# Patient Record
Sex: Female | Born: 1955 | Race: White | Hispanic: No | Marital: Single | Smoking: Former smoker
Health system: Southern US, Community
[De-identification: ages and names within clinical notes are randomized; demographics above are authoritative.]

## PROBLEM LIST (undated history)

## (undated) DIAGNOSIS — R519 Headache, unspecified: Secondary | ICD-10-CM

## (undated) DIAGNOSIS — E079 Disorder of thyroid, unspecified: Secondary | ICD-10-CM

## (undated) DIAGNOSIS — M1990 Unspecified osteoarthritis, unspecified site: Secondary | ICD-10-CM

## (undated) DIAGNOSIS — T7840XA Allergy, unspecified, initial encounter: Secondary | ICD-10-CM

## (undated) DIAGNOSIS — R51 Headache: Secondary | ICD-10-CM

## (undated) HISTORY — DX: Headache, unspecified: R51.9

## (undated) HISTORY — DX: Allergy, unspecified, initial encounter: T78.40XA

## (undated) HISTORY — PX: BREAST BIOPSY: SHX20

## (undated) HISTORY — DX: Disorder of thyroid, unspecified: E07.9

## (undated) HISTORY — DX: Unspecified osteoarthritis, unspecified site: M19.90

## (undated) HISTORY — DX: Headache: R51

---
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
## 2006-04-23 ENCOUNTER — Ambulatory Visit: Payer: Self-pay | Admitting: Internal Medicine

## 2006-11-24 ENCOUNTER — Ambulatory Visit: Payer: Self-pay | Admitting: Internal Medicine

## 2006-11-28 ENCOUNTER — Ambulatory Visit: Payer: Self-pay | Admitting: Internal Medicine

## 2007-01-08 ENCOUNTER — Inpatient Hospital Stay: Payer: Self-pay | Admitting: Specialist

## 2007-01-08 ENCOUNTER — Other Ambulatory Visit: Payer: Self-pay

## 2007-02-16 ENCOUNTER — Ambulatory Visit: Payer: Self-pay | Admitting: Gastroenterology

## 2007-06-05 ENCOUNTER — Ambulatory Visit: Payer: Self-pay | Admitting: Internal Medicine

## 2007-12-20 ENCOUNTER — Ambulatory Visit: Payer: Self-pay | Admitting: Internal Medicine

## 2008-01-03 ENCOUNTER — Ambulatory Visit: Payer: Self-pay | Admitting: Internal Medicine

## 2008-03-11 ENCOUNTER — Ambulatory Visit: Payer: Self-pay | Admitting: General Surgery

## 2008-09-18 ENCOUNTER — Ambulatory Visit: Payer: Self-pay | Admitting: General Surgery

## 2009-02-12 ENCOUNTER — Ambulatory Visit: Payer: Self-pay | Admitting: Family Medicine

## 2009-12-18 ENCOUNTER — Ambulatory Visit: Payer: Self-pay | Admitting: Family Medicine

## 2011-04-08 ENCOUNTER — Ambulatory Visit: Payer: Self-pay | Admitting: Family Medicine

## 2014-11-29 ENCOUNTER — Ambulatory Visit: Payer: Self-pay | Admitting: Nurse Practitioner

## 2014-12-02 ENCOUNTER — Ambulatory Visit (INDEPENDENT_AMBULATORY_CARE_PROVIDER_SITE_OTHER): Payer: Managed Care, Other (non HMO) | Admitting: Nurse Practitioner

## 2014-12-02 ENCOUNTER — Encounter: Payer: Self-pay | Admitting: Nurse Practitioner

## 2014-12-02 VITALS — BP 116/60 | HR 60 | Temp 97.9°F | Ht 65.5 in | Wt 133.8 lb

## 2014-12-02 DIAGNOSIS — Z7689 Persons encountering health services in other specified circumstances: Secondary | ICD-10-CM | POA: Insufficient documentation

## 2014-12-02 DIAGNOSIS — R51 Headache: Secondary | ICD-10-CM

## 2014-12-02 DIAGNOSIS — E039 Hypothyroidism, unspecified: Secondary | ICD-10-CM

## 2014-12-02 DIAGNOSIS — R519 Headache, unspecified: Secondary | ICD-10-CM | POA: Insufficient documentation

## 2014-12-02 DIAGNOSIS — Z7189 Other specified counseling: Secondary | ICD-10-CM | POA: Diagnosis not present

## 2014-12-02 LAB — TSH: TSH: 1.92 u[IU]/mL (ref 0.35–4.50)

## 2014-12-02 LAB — T4, FREE: Free T4: 1.16 ng/dL (ref 0.60–1.60)

## 2014-12-02 NOTE — Assessment & Plan Note (Signed)
Pt reports winter time is worst for her sinus headaches and she uses OTC measures. She reports the only thing that relieves them is for it to snow. Will follow

## 2014-12-02 NOTE — Assessment & Plan Note (Signed)
Will obtain labs today (TSH and free T4). Will refill for 30 days with refills to requested pharmacy when labs come back. Would like to stay with generic if possible.

## 2014-12-02 NOTE — Patient Instructions (Signed)
Welcome to [facility]! Nice to meet you today.   Please visit the lab before leaving today. Medication will be refilled upon receipt of your labs.   See you in 2017 for your physical. We will do fasting labs at that visit so nothing to eat or drink after midnight.

## 2014-12-02 NOTE — Progress Notes (Signed)
Patient ID: Meghan Higgins, female    DOB: [birth year]  Age: 59 y.o. MRN: [id number]  CC: Establish Care   HPI Meghan Higgins presents for establishing care and CC of medication refill.  1) New pt info:  Immunizations- tdap- 2011, flu- getting today at [facility] pharmacy  Mammogram- 2010, [facility]  Pap- 2011- at [facility]   Colonoscopy- 2007 good for 10 years per pt   Eye Exam- Willing to schedule, [facility], glasses  LMP- 2004, Postmenopausal   2) Chronic Problems-  Frequent headaches- Sinus related, year round, but worse in winter, takes Advil and it is somewhat helpful   Allergies- All year, Allegra-D or a benadryl   Thyroid problem- Need labs and refill today   3) Acute Problems-  Refill on Thyroid medication, would like to stay with generic if possible. Would like 30 day supply. Has not had labs in years she reports. [facility] last few refills. She has enough for the next few days.    History Meghan Higgins has a past medical history of Arthritis; Allergy; Thyroid disease; and Frequent headaches.   She has past surgical history that includes Appendectomy (1967).   Her family history includes Cancer in her father, maternal grandmother, and sister; Diabetes in her father and sister; Heart disease in her father; Stroke in her father.She reports that she quit smoking about a year ago. She does not have any smokeless tobacco history on file. She reports that she does not drink alcohol or use illicit drugs.  No outpatient prescriptions prior to visit.   No facility-administered medications prior to visit.    ROS Review of Systems  Constitutional: Negative for fever, chills, diaphoresis and fatigue.  Respiratory: Negative for chest tightness, shortness of breath and wheezing.   Cardiovascular: Negative for chest pain, palpitations and leg swelling.  Gastrointestinal: Negative for nausea, vomiting, diarrhea and rectal pain.  Skin:  Negative for rash.  Neurological: Negative for dizziness, weakness, numbness and headaches.  Psychiatric/Behavioral: The patient is not nervous/anxious.     Objective:  BP 116/60 mmHg  Pulse 60  Temp(Src) 97.9 F (36.6 C) (Oral)  Ht 5' 5.5" (1.664 m)  Wt 133 lb 12.8 oz (60.691 kg)  BMI 21.92 kg/m2  SpO2 97%  Physical Exam  Constitutional: She is oriented to person, place, and time. She appears well-developed and well-nourished. No distress.  HENT:  Head: Normocephalic and atraumatic.  Right Ear: External ear normal.  Left Ear: External ear normal.  Cardiovascular: Normal rate, regular rhythm and normal heart sounds.  Exam reveals no gallop and no friction rub.   No murmur heard. Pulmonary/Chest: Effort normal and breath sounds normal. No respiratory distress. She has no wheezes. She has no rales. She exhibits no tenderness.  Neurological: She is alert and oriented to person, place, and time. No cranial nerve deficit. She exhibits normal muscle tone. Coordination normal.  Skin: Skin is warm and dry. No rash noted. She is not diaphoretic.  Psychiatric: She has a normal mood and affect. Her behavior is normal. Judgment and thought content normal.   Assessment & Plan:   Demari was seen today for establish care.  Diagnoses and all orders for this visit:  Hypothyroidism, unspecified hypothyroidism type -     TSH -     T4, free  Encounter to establish care  Sinus headache   I am having Ms. Lansdowne maintain her fexofenadine-pseudoephedrine, levothyroxine, Biotin, multivitamin, and Fish Oil.  Meds ordered  this encounter  Medications  . fexofenadine-pseudoephedrine (ALLEGRA-D 24) 180-240 MG 24 hr tablet    Sig: Take 1 tablet by mouth daily.  Marland Kitchen. levothyroxine (SYNTHROID, LEVOTHROID) 88 MCG tablet    Sig: Take 88 mcg by mouth daily before breakfast.  . Biotin 300 MCG TABS    Sig: Take by mouth.  . Multiple Vitamin (MULTIVITAMIN) tablet    Sig: Take 1 tablet by mouth daily.  .  Omega-3 Fatty Acids (FISH OIL) 1000 MG CAPS    Sig: Take by mouth.     Follow-up: Return if symptoms worsen or fail to improve, for CPE w/ fasting labs in 2017.

## 2014-12-02 NOTE — Assessment & Plan Note (Signed)
Discussed acute and chronic issues. Reviewed health maintenance measures, PFSHx, and immunizations. Obtain TSH and free T4 today.  Pt will schedule physical exam for early 2017 and we will get health maintenance up to date.

## 2014-12-10 ENCOUNTER — Other Ambulatory Visit: Payer: Self-pay | Admitting: Nurse Practitioner

## 2014-12-10 MED ORDER — LEVOTHYROXINE SODIUM 88 MCG PO TABS: 88.0000 ug | ORAL_TABLET | Freq: Every day | ORAL | Status: DC

## 2015-05-04 ENCOUNTER — Other Ambulatory Visit: Payer: Self-pay | Admitting: Nurse Practitioner

## 2015-11-06 ENCOUNTER — Ambulatory Visit (INDEPENDENT_AMBULATORY_CARE_PROVIDER_SITE_OTHER): Payer: Managed Care, Other (non HMO) | Admitting: Family Medicine

## 2015-11-06 ENCOUNTER — Encounter (INDEPENDENT_AMBULATORY_CARE_PROVIDER_SITE_OTHER): Payer: Self-pay

## 2015-11-06 ENCOUNTER — Encounter: Payer: Self-pay | Admitting: Family Medicine

## 2015-11-06 VITALS — BP 120/78 | HR 83 | Temp 98.1°F | Wt 133.2 lb

## 2015-11-06 DIAGNOSIS — R059 Cough, unspecified: Secondary | ICD-10-CM

## 2015-11-06 DIAGNOSIS — R05 Cough: Secondary | ICD-10-CM

## 2015-11-06 DIAGNOSIS — J209 Acute bronchitis, unspecified: Secondary | ICD-10-CM

## 2015-11-06 MED ORDER — ALBUTEROL SULFATE (2.5 MG/3ML) 0.083% IN NEBU
2.5000 mg | INHALATION_SOLUTION | Freq: Once | RESPIRATORY_TRACT | Status: AC
Start: 2015-11-06 — End: 2015-11-06
  Administered 2015-11-06: 2.5 mg via RESPIRATORY_TRACT

## 2015-11-06 MED ORDER — DOXYCYCLINE HYCLATE 100 MG PO TABS: 100.0000 mg | ORAL_TABLET | Freq: Two times a day (BID) | ORAL | 0 refills | Status: DC

## 2015-11-06 MED ORDER — ALBUTEROL SULFATE HFA 108 (90 BASE) MCG/ACT IN AERS: 2.0000 | INHALATION_SPRAY | Freq: Four times a day (QID) | RESPIRATORY_TRACT | 0 refills | Status: DC | PRN

## 2015-11-06 NOTE — Progress Notes (Signed)
  Meghan AlarEric Karver Fadden, MD Phone: [phone]  Meghan HerbMarie E Higgins is a 60 y.o. female who presents today for same-day visit.  Patient notes 1 week of cough, sinus pressure, postnasal drip, and chest congestion. Cough productive of clear mucus. She has had some wheezing. Notes when she goes into a cooler environment such as the cooler at work she gets some bronchospasms. She notes no true fever though did have a MAXIMUM TEMPERATURE of 100.38F several days ago. She has a history of this in the past though none recently. Has tried Sudafed, Mucinex, and Flonase with some benefit.  PMH: Former smoker   ROS see history of present illness  Objective  Physical Exam Vitals:   11/06/15 1027  BP: 120/78  Pulse: 83  Temp: 98.1 F (36.7 C)    BP Readings from Last 3 Encounters:  11/06/15 120/78  12/02/14 116/60   Wt Readings from Last 3 Encounters:  11/06/15 133 lb 3.2 oz (60.4 kg)  12/02/14 133 lb 12.8 oz (60.7 kg)    Physical Exam  Constitutional: She is well-developed, well-nourished, and in no distress.  HENT:  Head: Normocephalic and atraumatic.  Mouth/Throat: Oropharynx is clear and moist. No oropharyngeal exudate.  Bilateral TMs obscured by cerumen  Silva: Conjunctivae are normal. Pupils are equal, round, and reactive to light.  Cardiovascular: Normal rate, regular rhythm and normal heart sounds.   Pulmonary/Chest: Effort normal. No respiratory distress. She has wheezes (scattered mild expiratory wheezes). She has no rales.  Musculoskeletal: She exhibits no edema.  Neurological: She is alert. Gait normal.  Skin: Skin is warm and dry.   Patient was given an albuterol nebulizer treatment in the office. She reported improvement in her symptoms. She continued to have mild scattered wheezes during the expiratory phase after the breathing treatment.  Assessment/Plan: Please see individual problem list.  Acute bronchitis Patient's symptoms most consistent with bronchitis and sinusitis. No  focal findings to indicate pneumonia. We will treat with doxycycline to cover for sinus infection and pulmonary causes. We will start her on an albuterol inhaler every 6 hours for the next 2 days and then as needed. She has an allergy to prednisone so we cannot treat with this. She'll continue to monitor. If not improving by Monday she will follow-up. Given return precautions.   No orders of the defined types were placed in this encounter.   Meds ordered this encounter  Medications  . fluticasone (FLONASE) 50 MCG/ACT nasal spray    Sig: Place into both nostrils daily.  Marland Kitchen. doxycycline (VIBRA-TABS) 100 MG tablet    Sig: Take 1 tablet (100 mg total) by mouth 2 (two) times daily.    Dispense:  14 tablet    Refill:  0  . albuterol (PROVENTIL HFA;VENTOLIN HFA) 108 (90 Base) MCG/ACT inhaler    Sig: Inhale 2 puffs into the lungs every 6 (six) hours as needed for wheezing or shortness of breath.    Dispense:  1 Inhaler    Refill:  0  . albuterol (PROVENTIL) (2.5 MG/3ML) 0.083% nebulizer solution 2.5 mg    Meghan AlarEric Meghan Charette, MD [facility]

## 2015-11-06 NOTE — Progress Notes (Signed)
Pre visit review using our clinic review tool, if applicable. No additional management support is needed unless otherwise documented below in the visit note. 

## 2015-11-06 NOTE — Assessment & Plan Note (Signed)
Patient's symptoms most consistent with bronchitis and sinusitis. No focal findings to indicate pneumonia. We will treat with doxycycline to cover for sinus infection and pulmonary causes. We will start her on an albuterol inhaler every 6 hours for the next 2 days and then as needed. She has an allergy to prednisone so we cannot treat with this. She'll continue to monitor. If not improving by Monday she will follow-up. Given return precautions.

## 2015-11-06 NOTE — Patient Instructions (Addendum)
Nice to meet you. You likely have a sinus infection and/or bronchitis. We will treat with doxycycline. You can use the albuterol inhaler every 6 hours for the next 2 days and then as needed. If you develop shortness of breath, cough productive of blood, fevers, or any new or changing symptoms please seek medical attention immediately.

## 2015-11-26 ENCOUNTER — Other Ambulatory Visit: Payer: Self-pay | Admitting: Nurse Practitioner

## 2015-11-26 NOTE — Telephone Encounter (Signed)
Next office visit 01/01/16 CPE establish care Last office visit 12/07/15 acute

## 2015-11-27 NOTE — Telephone Encounter (Signed)
Refill sent to pharmacy. Patient needs to keep her next office visit.

## 2015-11-27 NOTE — Telephone Encounter (Signed)
Patient advised and verbalized an underestanding

## 2016-01-01 ENCOUNTER — Ambulatory Visit (INDEPENDENT_AMBULATORY_CARE_PROVIDER_SITE_OTHER): Payer: Managed Care, Other (non HMO) | Admitting: Family Medicine

## 2016-01-01 ENCOUNTER — Other Ambulatory Visit (HOSPITAL_COMMUNITY)
Admission: RE | Admit: 2016-01-01 | Discharge: 2016-01-01 | Disposition: A | Discharge status: Home / Self Care | Payer: Managed Care, Other (non HMO) | Source: Ambulatory Visit | Attending: Family Medicine | Admitting: Family Medicine

## 2016-01-01 ENCOUNTER — Encounter: Payer: Self-pay | Admitting: Family Medicine

## 2016-01-01 ENCOUNTER — Telehealth: Payer: Self-pay | Admitting: Family Medicine

## 2016-01-01 VITALS — BP 100/72 | HR 65 | Temp 98.0°F | Wt 134.8 lb

## 2016-01-01 DIAGNOSIS — N649 Disorder of breast, unspecified: Secondary | ICD-10-CM | POA: Diagnosis not present

## 2016-01-01 DIAGNOSIS — Z1151 Encounter for screening for human papillomavirus (HPV): Secondary | ICD-10-CM | POA: Insufficient documentation

## 2016-01-01 DIAGNOSIS — Z114 Encounter for screening for human immunodeficiency virus [HIV]: Secondary | ICD-10-CM

## 2016-01-01 DIAGNOSIS — Z01419 Encounter for gynecological examination (general) (routine) without abnormal findings: Secondary | ICD-10-CM | POA: Insufficient documentation

## 2016-01-01 DIAGNOSIS — Z1159 Encounter for screening for other viral diseases: Secondary | ICD-10-CM

## 2016-01-01 DIAGNOSIS — Z1329 Encounter for screening for other suspected endocrine disorder: Secondary | ICD-10-CM

## 2016-01-01 DIAGNOSIS — Z13220 Encounter for screening for lipoid disorders: Secondary | ICD-10-CM

## 2016-01-01 DIAGNOSIS — Z0001 Encounter for general adult medical examination with abnormal findings: Secondary | ICD-10-CM | POA: Diagnosis not present

## 2016-01-01 DIAGNOSIS — Z124 Encounter for screening for malignant neoplasm of cervix: Secondary | ICD-10-CM

## 2016-01-01 DIAGNOSIS — Z130 Encounter for screening for diseases of the blood and blood-forming organs and certain disorders involving the immune mechanism: Secondary | ICD-10-CM

## 2016-01-01 DIAGNOSIS — Z23 Encounter for immunization: Secondary | ICD-10-CM

## 2016-01-01 DIAGNOSIS — Z1231 Encounter for screening mammogram for malignant neoplasm of breast: Secondary | ICD-10-CM

## 2016-01-01 DIAGNOSIS — Z1211 Encounter for screening for malignant neoplasm of colon: Secondary | ICD-10-CM

## 2016-01-01 DIAGNOSIS — Z0000 Encounter for general adult medical examination without abnormal findings: Secondary | ICD-10-CM | POA: Insufficient documentation

## 2016-01-01 LAB — LIPID PANEL
CHOL/HDL RATIO: 4
Cholesterol: 208 mg/dL — ABNORMAL HIGH (ref 0–200)
HDL: 48.6 mg/dL (ref >39.00)
LDL CALC: 138 mg/dL — AB (ref 0–99)
NONHDL: 159.64
TRIGLYCERIDES: 108 mg/dL (ref 0.0–149.0)
VLDL: 21.6 mg/dL (ref 0.0–40.0)

## 2016-01-01 LAB — COMPREHENSIVE METABOLIC PANEL
ALT: 18 U/L (ref 0–35)
AST: 16 U/L (ref 0–37)
Albumin: 4.6 g/dL (ref 3.5–5.2)
Alkaline Phosphatase: 74 U/L (ref 39–117)
BILIRUBIN TOTAL: 0.4 mg/dL (ref 0.2–1.2)
BUN: 20 mg/dL (ref 6–23)
CALCIUM: 9.5 mg/dL (ref 8.4–10.5)
CHLORIDE: 103 meq/L (ref 96–112)
CO2: 30 meq/L (ref 19–32)
Creatinine, Ser: 0.77 mg/dL (ref 0.40–1.20)
GFR: 81.09 mL/min (ref >60.00)
GLUCOSE: 87 mg/dL (ref 70–99)
Potassium: 4.5 mEq/L (ref 3.5–5.1)
Sodium: 140 mEq/L (ref 135–145)
Total Protein: 7 g/dL (ref 6.0–8.3)

## 2016-01-01 LAB — CBC
HCT: 39.2 % (ref 36.0–46.0)
Hemoglobin: 13.2 g/dL (ref 12.0–15.0)
MCHC: 33.6 g/dL (ref 30.0–36.0)
MCV: 85.1 fl (ref 78.0–100.0)
Platelets: 287 10*3/uL (ref 150.0–400.0)
RBC: 4.61 Mil/uL (ref 3.87–5.11)
RDW: 13.4 % (ref 11.5–15.5)
WBC: 5.2 10*3/uL (ref 4.0–10.5)

## 2016-01-01 LAB — TSH: TSH: 1.38 u[IU]/mL (ref 0.35–4.50)

## 2016-01-01 NOTE — Telephone Encounter (Signed)
FYI - Pt called back with information on colonoscopy. It was done at [facility] on the same day as surgery, she does not have the doctor's name.

## 2016-01-01 NOTE — Progress Notes (Signed)
Tommi Rumps, MD Phone: [phone]  Meghan Higgins is a 60 y.o. female who presents today for physical exam.  Diet is good. Eats English muffin or oatmeal for breakfast. 3 inches sub for lunch. Chicken and vegetables typically for dinner. Occasional red meat. No fried or fast food. Exercises at work by lifting 50 pound bags. Tetanus vaccination 5 years ago. She does not think she had chickenpox when she was younger. Flu shot up-to-date. Due for colonoscopy. Due for Pap smear. Due for mammogram. No HIV or hepatitis C testing previously.  With smoking 2 years ago. Smoked mostly half a pack a day for 40 years.  She sees an ophthalmologist once yearly. Sees a dentist twice a year.  Active Ambulatory Problems    Diagnosis Date Noted  . Thyroid activity decreased 12/02/2014  . Sinus headache 12/02/2014  . Acute bronchitis 11/06/2015  . Encounter for general adult medical examination with abnormal findings 01/01/2016   Resolved Ambulatory Problems    Diagnosis Date Noted  . Encounter to establish care 12/02/2014   Past Medical History:  Diagnosis Date  . Allergy   . Arthritis   . Frequent headaches   . Thyroid disease     Family History  Problem Relation Age of Onset  . Cancer Father   . Heart disease Father   . Stroke Father   . Diabetes Father   . Diabetes Sister   . Cancer Maternal Grandmother   . Cancer Sister     Social History   Social History  . Marital status: Single    Spouse name: N/A  . Number of children: N/A  . Years of education: N/A   Occupational History  . Not on file.   Social History Main Topics  . Smoking status: Former Smoker    Quit date: 12/01/2013  . Smokeless tobacco: Not on file  . Alcohol use No  . Drug use: No  . Sexual activity: No   Other Topics Concern  . 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 CV:  Negative for chest pain, dyspnea, edema, palpitations Resp: Negative for cough, dyspnea, hemoptysis GI: Negative for nausea, vomiting, diarrhea, constipation, abdominal pain, melena, hematochezia. GU: Negative for dysuria, incontinence, urinary hesitance, hematuria, vaginal or penile discharge, polyuria, sexual difficulty, lumps in testicle or breasts MSK: Negative for muscle cramps or aches, joint pain or swelling Neuro: Negative for headaches, weakness, numbness, dizziness, passing out/fainting Psych: Negative for depression, anxiety, memory problems  Objective  Physical Exam Vitals:   01/01/16 0906  BP: 100/72  Pulse: 65  Temp: 98 F (36.7 C)    BP Readings from Last 3 Encounters:  01/01/16 100/72  11/06/15 120/78  12/02/14 116/60   Wt Readings from Last 3 Encounters:  01/01/16 134 lb 12.8 oz (61.1 kg)  11/06/15 133 lb 3.2 oz (60.4 kg)  12/02/14 133 lb 12.8 oz (60.7 kg)    Physical Exam  Constitutional: She is well-developed, well-nourished,  and in no distress.  HENT:  Head: Normocephalic and atraumatic.  Mouth/Throat: Oropharynx is clear and moist. No oropharyngeal exudate.  Campoli: Conjunctivae are normal. Pupils are equal, round, and reactive to light.  Neck: Neck supple.  Cardiovascular: Normal rate, regular rhythm and normal heart sounds.   Pulmonary/Chest: Effort normal and breath sounds normal.  Abdominal: Soft. Bowel sounds are normal. She exhibits no distension. There is no tenderness.  Genitourinary:  Genitourinary Comments: Normal labia, normal vaginal mucosa, normal cervix, normal bimanual exam, bilateral breast with small lesion noted in the  inner lower quadrants, no other masses noted, no skin changes noted, no nipple inversion, bilateral axilla with no masses  Musculoskeletal: She exhibits no edema.  Lymphadenopathy:    She has no cervical adenopathy.  Neurological: She is alert. Gait normal.  Skin: Skin is warm and dry.  Psychiatric: Mood and affect normal.     Assessment/Plan:   Encounter for general adult medical examination with abnormal findings Overall patient is doing well. Weight is in the normal range. Blood pressure normal range. Encouraged continued diet and exercise. She is unsure if she had chickenpox as a child and thus we will obtain a varicella titer to determine if she needs a shingles vaccine. Tetanus vaccination up-to-date. Flu shot up-to-date. Due for colon cancer screening. She does not think she will be able to do a colonoscopy given that she has nobody to drive her. We will request her most recent colonoscopy report and if there are no polyps noted we could do cologuard. If there are polyps noted she will need to do a colonoscopy. She will contact us to let us know who did this prior colonoscopy. Lab work as outlined below. Pap smear performed today. Breast exam with possible fatty dense tissue in the bilateral inner lower quadrants and thus we will obtain diagnostic mammogram and ultrasounds.   Orders Placed This Encounter  Procedures  . MM Digital Screening    Standing Status:   Future    Standing Expiration Date:   03/03/2017    Order Specific Question:   Reason for Exam (SYMPTOM  OR DIAGNOSIS REQUIRED)    Answer:   screening    Order Specific Question:   Is the patient pregnant?    Answer:   No    Order Specific Question:   Preferred imaging location?    Answer:   [facility]  . MM Digital Diagnostic Bilat    Standing Status:   Future    Standing Expiration Date:   03/03/2017    Order Specific Question:   Reason for Exam (SYMPTOM  OR DIAGNOSIS REQUIRED)    Answer:   bilateral small lesions  noted in the inner lower quadrants    Order Specific Question:   Is the patient pregnant?    Answer:   No    Order Specific Question:   Preferred imaging location?    Answer:   [facility]  . US BREAST COMPLETE UNI LEFT INC AXILLA    Standing Status:   Future    Standing Expiration Date:   03/03/2017    Order Specific Question:   Reason for Exam (SYMPTOM  OR DIAGNOSIS REQUIRED)    Answer:   bilateral small lesions noted in the inner lower quadrants    Order Specific Question:   Preferred imaging location?    Answer:   [facility]  . US BREAST COMPLETE UNI RIGHT INC AXILLA    Standing Status:   Future    Standing  Expiration Date:   03/03/2017    Order Specific Question:   Reason for Exam (SYMPTOM  OR DIAGNOSIS REQUIRED)    Answer:   bilateral small lesions noted in the inner lower quadrants    Order Specific Question:   Preferred imaging location?    Answer:   [facility]  . TSH  . Comp Met (CMET)  . Lipid Profile  . CBC  . Varicella zoster antibody, IgG  . Hepatitis C Antibody  . HIV antibody (with reflex)    No orders of the defined types were placed in this encounter.    Tommi Rumps, MD [place]

## 2016-01-01 NOTE — Assessment & Plan Note (Addendum)
Overall patient is doing well. Weight is in the normal range. Blood pressure normal range. Encouraged continued diet and exercise. She is unsure if she had chickenpox as a child and thus we will obtain a varicella titer to determine if she needs a shingles vaccine. Tetanus vaccination up-to-date. Flu shot up-to-date. Due for colon cancer screening. She does not think she will be able to do a colonoscopy given that she has nobody to drive her. We will request her most recent colonoscopy report and if there are no polyps noted we could do cologuard. If there are polyps noted she will need to do a colonoscopy. She will contact us to let us know who did this prior colonoscopy. Lab work as outlined below. Pap smear performed today. Breast exam with possible fatty dense tissue in the bilateral inner lower quadrants and thus we will obtain diagnostic mammogram and ultrasounds. Former smoker though does not have a 30-pack-year history.

## 2016-01-01 NOTE — Telephone Encounter (Signed)
Noted, faxed to medical records at [facility]

## 2016-01-01 NOTE — Patient Instructions (Addendum)
Nice to see you. We'll get a mammogram. We'll send a Pap smear off. We'll get you set up for colonoscopy. We will check lab work and call you the results. Please continue to stay active and eat healthy.

## 2016-01-01 NOTE — Progress Notes (Signed)
Pre visit review using our clinic review tool, if applicable. No additional management support is needed unless otherwise documented below in the visit note. 

## 2016-01-02 LAB — HEPATITIS C ANTIBODY: HCV AB: NEGATIVE

## 2016-01-02 LAB — HIV ANTIBODY (ROUTINE TESTING W REFLEX): HIV 1&2 Ab, 4th Generation: NONREACTIVE

## 2016-01-02 LAB — VARICELLA ZOSTER ANTIBODY, IGG: Varicella IgG: <135 Index (ref <135.00)

## 2016-01-02 LAB — CYTOLOGY - PAP
Diagnosis: NEGATIVE
HPV: NOT DETECTED

## 2016-01-25 ENCOUNTER — Other Ambulatory Visit: Payer: Self-pay | Admitting: Family Medicine

## 2016-01-26 NOTE — Telephone Encounter (Signed)
Last tsh 01/01/16, last filled 11/27/15 30 1rf

## 2016-02-05 ENCOUNTER — Other Ambulatory Visit: Payer: Self-pay | Admitting: Family Medicine

## 2016-02-05 ENCOUNTER — Ambulatory Visit
Admission: RE | Admit: 2016-02-05 | Discharge: 2016-02-05 | Disposition: A | Discharge status: Home / Self Care | Payer: Managed Care, Other (non HMO) | Source: Ambulatory Visit | Attending: Family Medicine | Admitting: Family Medicine

## 2016-02-05 ENCOUNTER — Ambulatory Visit: Payer: Managed Care, Other (non HMO)

## 2016-02-05 DIAGNOSIS — N649 Disorder of breast, unspecified: Secondary | ICD-10-CM

## 2016-02-05 DIAGNOSIS — N6310 Unspecified lump in the right breast, unspecified quadrant: Secondary | ICD-10-CM | POA: Insufficient documentation

## 2016-02-05 DIAGNOSIS — N6320 Unspecified lump in the left breast, unspecified quadrant: Secondary | ICD-10-CM | POA: Diagnosis present

## 2016-03-26 ENCOUNTER — Other Ambulatory Visit: Payer: Self-pay | Admitting: Family Medicine

## 2016-05-20 ENCOUNTER — Encounter: Payer: Self-pay | Admitting: Podiatry

## 2016-05-20 ENCOUNTER — Ambulatory Visit (INDEPENDENT_AMBULATORY_CARE_PROVIDER_SITE_OTHER): Payer: Managed Care, Other (non HMO) | Admitting: Podiatry

## 2016-05-20 VITALS — BP 121/83 | HR 80

## 2016-05-20 DIAGNOSIS — L989 Disorder of the skin and subcutaneous tissue, unspecified: Secondary | ICD-10-CM

## 2016-05-20 DIAGNOSIS — B070 Plantar wart: Secondary | ICD-10-CM | POA: Diagnosis not present

## 2016-05-20 NOTE — Progress Notes (Signed)
   Subjective:    Patient ID: Meghan Higgins, female    DOB: [birth year], 61 y.o.   MRN: [id number]  HPI this patient presents the office with chief complaint of a painful callus under the ball of her left foot. She says that this callus has been present for over 15 years. She says that she files the callus down by herself at home. She has noticed that it is starting to increase in size and she presents the office today concerned about it growing.  Her only self treatment is debridement of the callus area when the callus becomes painful    Review of Systems  Musculoskeletal:       Joint pain       Objective:   Physical Exam GENERAL APPEARANCE: Alert, conversant. Appropriately groomed. No acute distress.  VASCULAR: Pedal pulses are  palpable at  [facility] bilateral.  Capillary refill time is immediate to all digits,  Normal temperature gradient.   NEUROLOGIC: sensation is normal to 5.07 monofilament at 5/5 sites bilateral.  Light touch is intact bilateral, Muscle strength normal.  MUSCULOSKELETAL: acceptable muscle strength, tone and stability bilateral.  Intrinsic muscluature intact bilateral.  Rectus appearance of foot and digits noted bilateral.   DERMATOLOGIC: skin color, texture, and turgor are within normal limits.  No preulcerative lesions or ulcers  are seen, no interdigital maceration noted.  No open lesions present.  Digital nails are asymptomatic. No drainage noted. Patient has a diffuse cauliflower-appearing lesion under the ball of the left foot. There are black thrombi noted and upon debridement, there is bleeding pain is elicited from lateral pressure        Assessment & Plan:  Benign skin lesion  Verrucae   IE  Debride infected skin.  Discussed treatment with this patient. I recommended a topical be used as opposed to excision of the lesion to the size of the main wart, left foot. She is to use salicylic acid at home and she will call me if she wants to proceed with further  treatment   Helane GuntherGregory Jesse Hirst DPM

## 2016-05-23 ENCOUNTER — Other Ambulatory Visit: Payer: Self-pay | Admitting: Family Medicine

## 2016-06-21 ENCOUNTER — Telehealth: Payer: Self-pay | Admitting: Family Medicine

## 2016-06-21 DIAGNOSIS — N6459 Other signs and symptoms in breast: Secondary | ICD-10-CM

## 2016-06-21 NOTE — Telephone Encounter (Signed)
Pt called and stated that she needs orders for a diagnostic mammogram and a breast ultrasound. Pt asked to please be scheduled for a Thursday morning. Please advise, thank you!

## 2016-06-21 NOTE — Telephone Encounter (Signed)
Order placed. She's not due for repeat until 08/04/16. She is due for diagnostic mammogram and then they will determine whether or not she needs ultrasounds based on the mammogram report from prior. We'll forward to HeboMelissa.

## 2016-06-21 NOTE — Telephone Encounter (Signed)
Please advise 

## 2016-06-23 NOTE — Telephone Encounter (Signed)
Called to schedule for pt. [facility] need the left breast ultrasound added since it is a possible ultrasound per report.  [id number].  Sorry! MAYBE one day they will give me clearance to order these and not have to send you message after message.  Thanks, General MillsMelissa

## 2016-06-23 NOTE — Telephone Encounter (Signed)
Order placed

## 2016-07-07 NOTE — Telephone Encounter (Signed)
Patient stated that she will need [facility]

## 2016-07-07 NOTE — Telephone Encounter (Signed)
Please advise 

## 2016-07-07 NOTE — Telephone Encounter (Signed)
Order changed.

## 2016-07-09 ENCOUNTER — Encounter: Payer: Self-pay | Admitting: Family Medicine

## 2016-07-19 ENCOUNTER — Other Ambulatory Visit: Payer: Self-pay | Admitting: Family Medicine

## 2016-08-11 ENCOUNTER — Other Ambulatory Visit: Payer: Managed Care, Other (non HMO)

## 2016-08-11 ENCOUNTER — Ambulatory Visit: Payer: Managed Care, Other (non HMO)

## 2016-09-09 ENCOUNTER — Ambulatory Visit
Admission: RE | Admit: 2016-09-09 | Discharge: 2016-09-09 | Disposition: A | Discharge status: Home / Self Care | Payer: Managed Care, Other (non HMO) | Source: Ambulatory Visit | Attending: Family Medicine | Admitting: Family Medicine

## 2016-09-09 DIAGNOSIS — N6459 Other signs and symptoms in breast: Secondary | ICD-10-CM | POA: Insufficient documentation

## 2016-09-15 ENCOUNTER — Other Ambulatory Visit: Payer: Self-pay | Admitting: Family Medicine

## 2016-11-21 ENCOUNTER — Other Ambulatory Visit: Payer: Self-pay | Admitting: Family Medicine

## 2017-01-18 ENCOUNTER — Other Ambulatory Visit: Payer: Self-pay | Admitting: Family Medicine

## 2017-01-18 NOTE — Telephone Encounter (Signed)
Patient has been scheduled for 02/24/2017.

## 2017-01-18 NOTE — Telephone Encounter (Signed)
Left message to return call to notify patient she needs to schedule an appointment to establish care for further refills

## 2017-02-24 ENCOUNTER — Encounter: Payer: Self-pay | Admitting: Family Medicine

## 2017-02-24 ENCOUNTER — Ambulatory Visit: Payer: Managed Care, Other (non HMO) | Admitting: Family Medicine

## 2017-02-24 ENCOUNTER — Other Ambulatory Visit: Payer: Self-pay

## 2017-02-24 VITALS — BP 112/78 | HR 80 | Temp 98.7°F | Wt 141.2 lb

## 2017-02-24 DIAGNOSIS — J309 Allergic rhinitis, unspecified: Secondary | ICD-10-CM | POA: Diagnosis not present

## 2017-02-24 DIAGNOSIS — E039 Hypothyroidism, unspecified: Secondary | ICD-10-CM

## 2017-02-24 DIAGNOSIS — R928 Other abnormal and inconclusive findings on diagnostic imaging of breast: Secondary | ICD-10-CM

## 2017-02-24 DIAGNOSIS — E7800 Pure hypercholesterolemia, unspecified: Secondary | ICD-10-CM

## 2017-02-24 NOTE — Assessment & Plan Note (Signed)
Repeat diagnostic mammogram and ultrasounds ordered.

## 2017-02-24 NOTE — Assessment & Plan Note (Signed)
Asymptomatic.  She will return for fasting labs.

## 2017-02-24 NOTE — Progress Notes (Signed)
Tommi Rumps, MD Phone: [phone]  Meghan Higgins is a 62 y.o. female who presents today for follow-up.  HYPOTHYROIDISM Disease Monitoring Weight changes: gain  Skin Changes: no Heat/Cold intolerance: no  Medication Monitoring Compliance:  Taking synthroid   Last TSH:   Lab Results  Component Value Date   TSH 1.38 01/01/2016   Allergic rhinitis: Uses Allegra-D daily.  Uses Flonase if she runs out Allegra-D.  Symptoms consist of stuffy sinuses and nose with headache.  Nothing out of her nose though does have postnasal drip.  She had an abnormal mammogram previously.  She is due for repeat. She is also due for colonoscopy though she declines wanting to do this and wants to do cologuard.  She notes no blood in her stool or family history of colon cancer.  She reports no polyps on prior colonoscopy.  No report is available.   Social History   Tobacco Use  Smoking Status Former Smoker  . Last attempt to quit: 12/01/2013  . Years since quitting: 3.2  Smokeless Tobacco Never Used     ROS see history of present illness  Objective  Physical Exam Vitals:   02/24/17 1124  BP: 112/78  Pulse: 80  Temp: 98.7 F (37.1 C)  SpO2: 98%    BP Readings from Last 3 Encounters:  02/24/17 112/78  05/20/16 121/83  01/01/16 100/72   Wt Readings from Last 3 Encounters:  02/24/17 141 lb 3.2 oz (64 kg)  01/01/16 134 lb 12.8 oz (61.1 kg)  11/06/15 133 lb 3.2 oz (60.4 kg)    Physical Exam  Constitutional: No distress.  Cardiovascular: Normal rate, regular rhythm and normal heart sounds.  Pulmonary/Chest: Effort normal and breath sounds normal.  Musculoskeletal: She exhibits no edema.  Neurological: She is alert. Gait normal.  Skin: Skin is warm and dry. She is not diaphoretic.     Assessment/Plan: Please see individual problem list.  Thyroid activity decreased Asymptomatic.  She will return for fasting labs.  Abnormal mammogram Repeat diagnostic mammogram and  ultrasounds ordered.  Allergic rhinitis Discussed trying to not use the decongestant aspect of Allegra.  She can trial Flonase 2 sprays each nostril daily.  If this is beneficial she will contact us and we can refill.   Orders Placed This Encounter  Procedures  . MM DIAG BREAST TOMO BILATERAL    Standing Status:   Future    Standing Expiration Date:   04/25/2018    Order Specific Question:   Reason for Exam (SYMPTOM  OR DIAGNOSIS REQUIRED)    Answer:   6 month follow-up abnormal mammo    Order Specific Question:   Preferred imaging location?    Answer:   [facility]  . US BREAST LTD UNI RIGHT INC AXILLA    Standing Status:   Future    Standing Expiration Date:   04/25/2018    Order Specific Question:   Reason for Exam (SYMPTOM  OR DIAGNOSIS REQUIRED)    Answer:   6 month follow-up abnormal mammo    Order Specific Question:   Preferred imaging location?    Answer:   [facility]  . US BREAST LTD UNI LEFT INC AXILLA    Standing Status:   Future    Standing Expiration Date:   04/25/2018    Order Specific Question:   Reason for Exam (SYMPTOM  OR DIAGNOSIS REQUIRED)    Answer:   6 month follow-up abnormal mammo    Order Specific Question:   Preferred imaging location?  Answer:   [facility]  . Comp Met (CMET)    Standing Status:   Future    Standing Expiration Date:   02/24/2018  . TSH    Standing Status:   Future    Standing Expiration Date:   02/24/2018  . Lipid panel    Standing Status:   Future    Standing Expiration Date:   02/24/2018    No orders of the defined types were placed in this encounter.    Tommi Rumps, MD [place]

## 2017-02-24 NOTE — Patient Instructions (Signed)
Nice to see you. We will have you return for fasting lab work. We will get you set up for your follow-up mammogram. Please try the Flonase 2 sprays each nostril daily and see if that helps more with your allergies.

## 2017-02-24 NOTE — Assessment & Plan Note (Signed)
Discussed trying to not use the decongestant aspect of Allegra.  She can trial Flonase 2 sprays each nostril daily.  If this is beneficial she will contact us and we can refill.

## 2017-02-25 ENCOUNTER — Other Ambulatory Visit (INDEPENDENT_AMBULATORY_CARE_PROVIDER_SITE_OTHER): Payer: Managed Care, Other (non HMO)

## 2017-02-25 DIAGNOSIS — E7800 Pure hypercholesterolemia, unspecified: Secondary | ICD-10-CM

## 2017-02-25 DIAGNOSIS — E039 Hypothyroidism, unspecified: Secondary | ICD-10-CM | POA: Diagnosis not present

## 2017-02-25 LAB — COMPREHENSIVE METABOLIC PANEL
ALT: 20 U/L (ref 0–35)
AST: 17 U/L (ref 0–37)
Albumin: 4.5 g/dL (ref 3.5–5.2)
Alkaline Phosphatase: 73 U/L (ref 39–117)
BUN: 20 mg/dL (ref 6–23)
CHLORIDE: 100 meq/L (ref 96–112)
CO2: 29 mEq/L (ref 19–32)
CREATININE: 0.82 mg/dL (ref 0.40–1.20)
Calcium: 9.7 mg/dL (ref 8.4–10.5)
GFR: 75.12 mL/min (ref >60.00)
Glucose, Bld: 83 mg/dL (ref 70–99)
Potassium: 4.5 mEq/L (ref 3.5–5.1)
Sodium: 138 mEq/L (ref 135–145)
TOTAL PROTEIN: 7.1 g/dL (ref 6.0–8.3)
Total Bilirubin: 0.3 mg/dL (ref 0.2–1.2)

## 2017-02-25 LAB — LIPID PANEL
CHOLESTEROL: 207 mg/dL — AB (ref 0–200)
HDL: 42.1 mg/dL (ref >39.00)
LDL Cholesterol: 130 mg/dL — ABNORMAL HIGH (ref 0–99)
NonHDL: 165.15
Total CHOL/HDL Ratio: 5
Triglycerides: 175 mg/dL — ABNORMAL HIGH (ref 0.0–149.0)
VLDL: 35 mg/dL (ref 0.0–40.0)

## 2017-02-25 LAB — TSH: TSH: 4.37 u[IU]/mL (ref 0.35–4.50)

## 2017-02-25 NOTE — Progress Notes (Signed)
The 10-year ASCVD risk score Denman George(Goff DC Montez HagemanJr., et al., 2013) is: 3.4%   Values used to calculate the score:     Age: [age] years     Sex: Female     Is Non-Hispanic African American: No     Diabetic: No     Tobacco smoker: No     Systolic Blood Pressure: 112 mmHg     Is BP treated: No     HDL Cholesterol: 42.1 mg/dL     Total Cholesterol: 207 mg/dL

## 2017-03-04 ENCOUNTER — Other Ambulatory Visit: Payer: Managed Care, Other (non HMO)

## 2017-03-07 LAB — COLOGUARD: Cologuard: NEGATIVE

## 2017-03-08 ENCOUNTER — Other Ambulatory Visit: Payer: Managed Care, Other (non HMO)

## 2017-03-14 ENCOUNTER — Encounter: Payer: Self-pay | Admitting: Family Medicine

## 2017-03-16 ENCOUNTER — Other Ambulatory Visit: Payer: Self-pay | Admitting: Family Medicine

## 2017-03-17 ENCOUNTER — Ambulatory Visit
Admission: RE | Admit: 2017-03-17 | Discharge: 2017-03-17 | Disposition: A | Discharge status: Home / Self Care | Payer: Managed Care, Other (non HMO) | Source: Ambulatory Visit | Attending: Family Medicine | Admitting: Family Medicine

## 2017-03-17 DIAGNOSIS — N6321 Unspecified lump in the left breast, upper outer quadrant: Secondary | ICD-10-CM | POA: Diagnosis not present

## 2017-03-17 DIAGNOSIS — R928 Other abnormal and inconclusive findings on diagnostic imaging of breast: Secondary | ICD-10-CM

## 2017-03-23 ENCOUNTER — Telehealth: Payer: Self-pay

## 2017-03-23 NOTE — Telephone Encounter (Signed)
Left detailed message to notify patient that her cologuard is negative.

## 2018-01-13 ENCOUNTER — Other Ambulatory Visit: Payer: Self-pay | Admitting: Family Medicine

## 2018-07-10 ENCOUNTER — Telehealth: Payer: Self-pay | Admitting: Family Medicine

## 2018-07-10 NOTE — Telephone Encounter (Signed)
This patient has not been seen in over a year. Please call her to get her scheduled for follow-up. Please contact the patients pharmacy and alter her prescription to include no refills. She will need to follow-up prior to receiving further refills. Thanks.

## 2018-07-12 NOTE — Telephone Encounter (Signed)
Called and left a message because no one would pick up informing the pharmacy to change the refills on the thyroid medication to 0 and to inform the patient to call to schedule a follow up appt. To get further refills.  Nina,cma

## 2018-07-12 NOTE — Telephone Encounter (Signed)
Called the patient and informed her that she needs a follow up appt  Before she could have any more refills pt states she has 2 jobs but will call back for a appointment, once she looks at her schedule.  Nina,cma

## 2018-07-21 ENCOUNTER — Encounter: Payer: Self-pay | Admitting: Family Medicine

## 2018-07-21 ENCOUNTER — Other Ambulatory Visit: Payer: Self-pay

## 2018-07-21 ENCOUNTER — Ambulatory Visit (INDEPENDENT_AMBULATORY_CARE_PROVIDER_SITE_OTHER): Payer: Managed Care, Other (non HMO) | Admitting: Family Medicine

## 2018-07-21 VITALS — BP 118/71 | HR 70 | Wt 138.0 lb

## 2018-07-21 DIAGNOSIS — J309 Allergic rhinitis, unspecified: Secondary | ICD-10-CM

## 2018-07-21 DIAGNOSIS — R928 Other abnormal and inconclusive findings on diagnostic imaging of breast: Secondary | ICD-10-CM | POA: Diagnosis not present

## 2018-07-21 DIAGNOSIS — E039 Hypothyroidism, unspecified: Secondary | ICD-10-CM | POA: Diagnosis not present

## 2018-07-21 DIAGNOSIS — E7800 Pure hypercholesterolemia, unspecified: Secondary | ICD-10-CM | POA: Diagnosis not present

## 2018-07-21 DIAGNOSIS — B880 Other acariasis: Secondary | ICD-10-CM

## 2018-07-21 NOTE — Assessment & Plan Note (Addendum)
Discussed trying to avoid the location of the chiggers.  Zyrtec for itching.

## 2018-07-21 NOTE — Assessment & Plan Note (Signed)
Check cholesterol.

## 2018-07-21 NOTE — Progress Notes (Signed)
Virtual Visit via telephone Note  This visit type was conducted due to national recommendations for restrictions regarding the COVID-19 pandemic (e.g. social distancing).  This format is felt to be most appropriate for this patient at this time.  All issues noted in this document were discussed and addressed.  No physical exam was performed (except for noted visual exam findings with Video Visits).   I connected with Meghan Higgins today at  8:00 AM EDT by telephone and verified that I am speaking with the correct person using two identifiers. Location patient: home Location provider: work Persons participating in the virtual visit: patient, provider  I discussed the limitations, risks, security and privacy concerns of performing an evaluation and management service by telephone and the availability of in person appointments. I also discussed with the patient that there may be a patient responsible charge related to this service. The patient expressed understanding and agreed to proceed.  Interactive audio and video telecommunications were attempted between this provider and patient, however failed, due to patient having technical difficulties OR patient did not have access to video capability.  We continued and completed visit with audio only.   Reason for visit: follow-up  HPI: HYPOTHYROIDISM Disease Monitoring Weight changes: no  Skin Changes: no Heat/Cold intolerance: no  Medication Monitoring Compliance:  Taking synthroid   Last TSH:   Lab Results  Component Value Date   TSH 4.37 02/25/2017    Allergic rhinitis: Using Flonase and Allegra.  No rhinorrhea or sneezing.  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  She takes Allegra to help with the itching.  Abnormal mammogram: Patient had to delay her mammogram given COVID-19 pandemic.  She has not had this scheduled.  She notes no breast masses or nipple discharge.  Previous  mammograms with likely benign lesion.   ROS: See pertinent positives and negatives per HPI.  Past Medical History:  Diagnosis Date  . Allergy   . Arthritis   . Frequent headaches   . Thyroid disease     Past Surgical History:  Procedure Laterality Date  . APPENDECTOMY  1967    Family History  Problem Relation Age of Onset  . Cancer Father   . Heart disease Father   . Stroke Father   . Diabetes Father   . Diabetes Sister   . Cancer Sister   . Cancer Maternal Grandmother   . Breast cancer Maternal Grandmother     SOCIAL HX: Former smoker   Current Outpatient Medications:  .  Ascorbic Acid (VITAMIN C) 100 MG tablet, Take 100 mg by mouth daily., Disp: , Rfl:  .  Biotin 300 MCG TABS, Take by mouth., Disp: , Rfl:  .  cholecalciferol (VITAMIN D) 1000 units tablet, Take 1,000 Units by mouth daily., Disp: , Rfl:  .  Cyanocobalamin (VITAMIN B 12 PO), Take by mouth., Disp: , Rfl:  .  fexofenadine-pseudoephedrine (ALLEGRA-D 24) 180-240 MG 24 hr tablet, Take 1 tablet by mouth daily., Disp: , Rfl:  .  fluticasone (FLONASE) 50 MCG/ACT nasal spray, Place into both nostrils daily., Disp: , Rfl:  .  levothyroxine (SYNTHROID) 88 MCG tablet, TAKE ONE TABLET BY MOUTH EVERY MORNING BEFORE BREAKFAST, Disp: 90 tablet, Rfl: 2 .  Multiple Vitamin (MULTIVITAMIN) tablet, Take 1 tablet by mouth daily., Disp: , Rfl:  .  Omega-3 Fatty Acids (FISH OIL) 1000 MG CAPS, Take by mouth., Disp: , Rfl:   EXAM: This was a telehealth  telephone visit was no physical exam was completed.  ASSESSMENT AND PLAN:  Discussed the following assessment and plan:  Allergic rhinitis Asymptomatic.  Continue Flonase and Allegra.  Hypothyroidism Stable on current medication.  Check TSH.  Abnormal mammogram Diagnostic mammogram and ultrasound ordered.  Discussed the importance of having this completed.  Elevated LDL cholesterol level Check cholesterol.  Chigger bites Discussed trying to avoid the location of  the chiggers.  Zyrtec for itching.  Social distancing precautions and sick precautions given regarding COVID-19.   I discussed the assessment and treatment plan with the patient. The patient was provided an opportunity to ask questions and all were answered. The patient agreed with the plan and demonstrated an understanding of the instructions.   The patient was advised to call back or seek an in-person evaluation if the symptoms worsen or if the condition fails to improve as anticipated.  I provided 9 minutes of non-face-to-face time during this encounter.   Marikay AlarEric , MD

## 2018-07-21 NOTE — Assessment & Plan Note (Signed)
Asymptomatic.  Continue Flonase and Allegra.

## 2018-07-21 NOTE — Assessment & Plan Note (Signed)
Diagnostic mammogram and ultrasound ordered.  Discussed the importance of having this completed.

## 2018-07-21 NOTE — Assessment & Plan Note (Signed)
Stable on current medication.  Check TSH.

## 2018-08-08 ENCOUNTER — Ambulatory Visit
Admission: RE | Admit: 2018-08-08 | Discharge: 2018-08-08 | Disposition: A | Discharge status: Home / Self Care | Payer: Managed Care, Other (non HMO) | Source: Ambulatory Visit | Attending: Family Medicine | Admitting: Family Medicine

## 2018-08-08 ENCOUNTER — Other Ambulatory Visit: Payer: Self-pay

## 2018-08-08 DIAGNOSIS — R928 Other abnormal and inconclusive findings on diagnostic imaging of breast: Secondary | ICD-10-CM

## 2018-08-09 ENCOUNTER — Other Ambulatory Visit: Payer: Self-pay | Admitting: Family Medicine

## 2018-08-09 DIAGNOSIS — N6320 Unspecified lump in the left breast, unspecified quadrant: Secondary | ICD-10-CM

## 2018-08-09 DIAGNOSIS — R928 Other abnormal and inconclusive findings on diagnostic imaging of breast: Secondary | ICD-10-CM

## 2018-08-17 ENCOUNTER — Ambulatory Visit
Admission: RE | Admit: 2018-08-17 | Discharge: 2018-08-17 | Disposition: A | Discharge status: Home / Self Care | Payer: Managed Care, Other (non HMO) | Source: Ambulatory Visit | Attending: Family Medicine | Admitting: Family Medicine

## 2018-08-17 ENCOUNTER — Other Ambulatory Visit: Payer: Self-pay

## 2018-08-17 DIAGNOSIS — N6320 Unspecified lump in the left breast, unspecified quadrant: Secondary | ICD-10-CM

## 2018-08-17 DIAGNOSIS — R928 Other abnormal and inconclusive findings on diagnostic imaging of breast: Secondary | ICD-10-CM | POA: Diagnosis present

## 2018-08-17 HISTORY — PX: BREAST BIOPSY: SHX20

## 2018-08-18 LAB — SURGICAL PATHOLOGY

## 2018-08-24 ENCOUNTER — Other Ambulatory Visit (INDEPENDENT_AMBULATORY_CARE_PROVIDER_SITE_OTHER): Payer: Managed Care, Other (non HMO)

## 2018-08-24 ENCOUNTER — Other Ambulatory Visit: Payer: Self-pay

## 2018-08-24 DIAGNOSIS — E7800 Pure hypercholesterolemia, unspecified: Secondary | ICD-10-CM | POA: Diagnosis not present

## 2018-08-24 DIAGNOSIS — E039 Hypothyroidism, unspecified: Secondary | ICD-10-CM

## 2018-08-24 LAB — LIPID PANEL
Cholesterol: 196 mg/dL (ref 0–200)
HDL: 41.7 mg/dL (ref >39.00)
LDL Cholesterol: 120 mg/dL — ABNORMAL HIGH (ref 0–99)
NonHDL: 153.84
Total CHOL/HDL Ratio: 5
Triglycerides: 171 mg/dL — ABNORMAL HIGH (ref 0.0–149.0)
VLDL: 34.2 mg/dL (ref 0.0–40.0)

## 2018-08-24 LAB — COMPREHENSIVE METABOLIC PANEL
ALT: 17 U/L (ref 0–35)
AST: 17 U/L (ref 0–37)
Albumin: 4.4 g/dL (ref 3.5–5.2)
Alkaline Phosphatase: 69 U/L (ref 39–117)
BUN: 22 mg/dL (ref 6–23)
CO2: 26 mEq/L (ref 19–32)
Calcium: 9.6 mg/dL (ref 8.4–10.5)
Chloride: 102 mEq/L (ref 96–112)
Creatinine, Ser: 0.81 mg/dL (ref 0.40–1.20)
GFR: 71.34 mL/min (ref >60.00)
Glucose, Bld: 82 mg/dL (ref 70–99)
Potassium: 4.5 mEq/L (ref 3.5–5.1)
Sodium: 137 mEq/L (ref 135–145)
Total Bilirubin: 0.3 mg/dL (ref 0.2–1.2)
Total Protein: 6.6 g/dL (ref 6.0–8.3)

## 2018-08-24 LAB — TSH: TSH: 2.13 u[IU]/mL (ref 0.35–4.50)

## 2018-10-06 ENCOUNTER — Other Ambulatory Visit: Payer: Self-pay | Admitting: Family Medicine

## 2018-11-24 ENCOUNTER — Other Ambulatory Visit: Payer: Managed Care, Other (non HMO)

## 2019-01-24 ENCOUNTER — Encounter: Payer: Self-pay | Admitting: Family Medicine

## 2019-01-24 ENCOUNTER — Other Ambulatory Visit: Payer: Self-pay

## 2019-01-24 ENCOUNTER — Ambulatory Visit (INDEPENDENT_AMBULATORY_CARE_PROVIDER_SITE_OTHER): Payer: Managed Care, Other (non HMO) | Admitting: Family Medicine

## 2019-01-24 VITALS — BP 122/73 | HR 70 | Ht 65.0 in | Wt 133.0 lb

## 2019-01-24 DIAGNOSIS — E039 Hypothyroidism, unspecified: Secondary | ICD-10-CM

## 2019-01-24 DIAGNOSIS — J309 Allergic rhinitis, unspecified: Secondary | ICD-10-CM

## 2019-01-24 DIAGNOSIS — R928 Other abnormal and inconclusive findings on diagnostic imaging of breast: Secondary | ICD-10-CM | POA: Diagnosis not present

## 2019-01-24 DIAGNOSIS — Z1231 Encounter for screening mammogram for malignant neoplasm of breast: Secondary | ICD-10-CM

## 2019-01-24 NOTE — Assessment & Plan Note (Signed)
Benign biopsy.  She will plan for a screening mammogram in July 2021.  Order placed.  She knows she has to call to schedule sometime in July.

## 2019-01-24 NOTE — Progress Notes (Signed)
Virtual Visit via telephone Note  This visit type was conducted due to national recommendations for restrictions regarding the COVID-19 pandemic (e.g. social distancing).  This format is felt to be most appropriate for this patient at this time.  All issues noted in this document were discussed and addressed.  No physical exam was performed (except for noted visual exam findings with Video Visits).   I connected with Meghan Higgins today at  8:30 AM EST by telephone and verified that I am speaking with the correct person using two identifiers. Location patient: home Location provider: work Persons participating in the virtual visit: patient, provider  I discussed the limitations, risks, security and privacy concerns of performing an evaluation and management service by telephone and the availability of in person appointments. I also discussed with the patient that there may be a patient responsible charge related to this service. The patient expressed understanding and agreed to proceed.  Interactive audio and video telecommunications were attempted between this provider and patient, however failed, due to patient having technical difficulties OR patient did not have access to video capability.  We continued and completed visit with audio only.   Reason for visit: follow-up  HPI: Allergic rhinitis: Takes Flonase 1 spray each nostril daily.  Occasionally takes fexofenadine with pseudoephedrine for congestion.  She notes this time of year is when she typically has her allergy symptoms with some sinus headaches, rhinorrhea, and postnasal drip.  No sneezing.  Hypothyroidism: Taking Synthroid.  No skin changes, heat intolerance, or cold intolerance.  Abnormal mammogram: She went for diagnostic mammogram and ultrasound and then subsequently had a biopsy which revealed benign findings.  She notes no lumps or lesions.  She knows she will not be having another biopsy given the cost.   ROS: See  pertinent positives and negatives per HPI.  Past Medical History:  Diagnosis Date  . Allergy   . Arthritis   . Frequent headaches   . Thyroid disease     Past Surgical History:  Procedure Laterality Date  . APPENDECTOMY  1967    Family History  Problem Relation Age of Onset  . Cancer Father   . Heart disease Father   . Stroke Father   . Diabetes Father   . Diabetes Sister   . Cancer Sister   . Cancer Maternal Grandmother   . Breast cancer Maternal Grandmother     SOCIAL HX: Former smoker   Current Outpatient Medications:  .  Biotin 300 MCG TABS, Take by mouth., Disp: , Rfl:  .  cholecalciferol (VITAMIN D) 1000 units tablet, Take 1,000 Units by mouth daily., Disp: , Rfl:  .  fluticasone (FLONASE) 50 MCG/ACT nasal spray, Place into both nostrils daily., Disp: , Rfl:  .  levothyroxine (SYNTHROID) 88 MCG tablet, TAKE ONE TABLET BY MOUTH EVERY MORNING BEFORE BREAKFAST, Disp: 90 tablet, Rfl: 2 .  Multiple Vitamin (MULTIVITAMIN) tablet, Take 1 tablet by mouth daily., Disp: , Rfl:  .  Ascorbic Acid (VITAMIN C) 100 MG tablet, Take 100 mg by mouth daily., Disp: , Rfl:  .  Cyanocobalamin (VITAMIN B 12 PO), Take by mouth., Disp: , Rfl:  .  fexofenadine-pseudoephedrine (ALLEGRA-D 24) 180-240 MG 24 hr tablet, Take 1 tablet by mouth daily., Disp: , Rfl:  .  Omega-3 Fatty Acids (FISH OIL) 1000 MG CAPS, Take by mouth., Disp: , Rfl:   EXAM: This is a telehealth telephone visit and thus no physical exam was completed.  ASSESSMENT AND PLAN:  Discussed the following  assessment and plan:  Allergic rhinitis Occasional symptoms this time a year.  Discussed taking plain fexofenadine daily.  She can continue Flonase 1 spray each nostril.  If that combination is not adequate she can increase the Flonase to 2 sprays each nostril daily.  Hypothyroidism TSH adequately controlled on last check.  Discussed checking once yearly.  Abnormal mammogram Benign biopsy.  She will plan for a screening  mammogram in July 2021.  Order placed.  She knows she has to call to schedule sometime in July.   Orders Placed This Encounter  Procedures  . MM 3D SCREEN BREAST BILATERAL    Standing Status:   Future    Standing Expiration Date:   03/23/2020    Order Specific Question:   Reason for Exam (SYMPTOM  OR DIAGNOSIS REQUIRED)    Answer:   breast cancer screening    Order Specific Question:   Preferred imaging location?    Answer:   [facility]    No orders of the defined types were placed in this encounter.    I discussed the assessment and treatment plan with the patient. The patient was provided an opportunity to ask questions and all were answered. The patient agreed with the plan and demonstrated an understanding of the instructions.   The patient was advised to call back or seek an in-person evaluation if the symptoms worsen or if the condition fails to improve as anticipated.  I provided 12 minutes of non-face-to-face time during this encounter.   Marikay Alar, MD

## 2019-01-24 NOTE — Assessment & Plan Note (Signed)
Occasional symptoms this time a year.  Discussed taking plain fexofenadine daily.  She can continue Flonase 1 spray each nostril.  If that combination is not adequate she can increase the Flonase to 2 sprays each nostril daily.

## 2019-01-24 NOTE — Assessment & Plan Note (Signed)
TSH adequately controlled on last check.  Discussed checking once yearly.

## 2019-02-06 ENCOUNTER — Encounter: Payer: Self-pay | Admitting: Family Medicine

## 2019-02-07 ENCOUNTER — Encounter: Payer: Self-pay | Admitting: Family Medicine

## 2019-07-02 ENCOUNTER — Other Ambulatory Visit: Payer: Self-pay | Admitting: Family Medicine

## 2019-09-28 ENCOUNTER — Other Ambulatory Visit: Payer: Self-pay | Admitting: Family Medicine

## 2019-12-29 ENCOUNTER — Other Ambulatory Visit: Payer: Self-pay | Admitting: Family Medicine

## 2020-01-25 ENCOUNTER — Ambulatory Visit: Payer: Managed Care, Other (non HMO) | Admitting: Family Medicine

## 2020-02-08 ENCOUNTER — Other Ambulatory Visit: Payer: Self-pay

## 2020-02-12 ENCOUNTER — Encounter: Payer: Self-pay | Admitting: Family Medicine

## 2020-02-12 ENCOUNTER — Other Ambulatory Visit: Payer: Self-pay

## 2020-02-12 ENCOUNTER — Ambulatory Visit: Payer: Managed Care, Other (non HMO) | Admitting: Family Medicine

## 2020-02-12 VITALS — BP 120/80 | HR 75 | Temp 97.6°F | Ht 65.5 in | Wt 134.8 lb

## 2020-02-12 DIAGNOSIS — E039 Hypothyroidism, unspecified: Secondary | ICD-10-CM | POA: Diagnosis not present

## 2020-02-12 DIAGNOSIS — Z1231 Encounter for screening mammogram for malignant neoplasm of breast: Secondary | ICD-10-CM

## 2020-02-12 DIAGNOSIS — E7800 Pure hypercholesterolemia, unspecified: Secondary | ICD-10-CM | POA: Diagnosis not present

## 2020-02-12 LAB — LIPID PANEL
Cholesterol: 222 mg/dL — ABNORMAL HIGH (ref 0–200)
HDL: 46.1 mg/dL (ref >39.00)
LDL Cholesterol: 144 mg/dL — ABNORMAL HIGH (ref 0–99)
NonHDL: 176.19
Total CHOL/HDL Ratio: 5
Triglycerides: 161 mg/dL — ABNORMAL HIGH (ref 0.0–149.0)
VLDL: 32.2 mg/dL (ref 0.0–40.0)

## 2020-02-12 LAB — COMPREHENSIVE METABOLIC PANEL
ALT: 15 U/L (ref 0–35)
AST: 17 U/L (ref 0–37)
Albumin: 4.5 g/dL (ref 3.5–5.2)
Alkaline Phosphatase: 78 U/L (ref 39–117)
BUN: 20 mg/dL (ref 6–23)
CO2: 28 mEq/L (ref 19–32)
Calcium: 9.9 mg/dL (ref 8.4–10.5)
Chloride: 104 mEq/L (ref 96–112)
Creatinine, Ser: 0.75 mg/dL (ref 0.40–1.20)
GFR: 83.93 mL/min (ref >60.00)
Glucose, Bld: 74 mg/dL (ref 70–99)
Potassium: 4.3 mEq/L (ref 3.5–5.1)
Sodium: 135 mEq/L (ref 135–145)
Total Bilirubin: 0.4 mg/dL (ref 0.2–1.2)
Total Protein: 6.8 g/dL (ref 6.0–8.3)

## 2020-02-12 LAB — TSH: TSH: 1.99 u[IU]/mL (ref 0.35–4.50)

## 2020-02-12 NOTE — Patient Instructions (Signed)
Nice to see you. We will get labs today. Please call to schedule your mammogram when you are ready to do this. If you do not receive the Cologuard in the mail in early March please let us know.

## 2020-02-12 NOTE — Assessment & Plan Note (Signed)
Check TSH.  Continue Synthroid. 

## 2020-02-12 NOTE — Progress Notes (Signed)
Tommi Rumps, MD Phone: [phone]  Meghan Higgins is a 65 y.o. female who presents today for f/u.  HYPOTHYROIDISM Disease Monitoring Weight changes: no  Skin Changes: no Heat/Cold intolerance: no  Medication Monitoring Compliance:  Taking synthroid 88 mcg daily Last TSH:   Lab Results  Component Value Date   TSH 2.13 08/24/2018   Elevated LDL: Patient has no chest pain or claudication.  Tries to stay as active as she is able to with yard work.  She does have a fried egg in the morning and a slice of cheese pizza at lunch.  There is chicken and vegetables for dinner.  No sugary drinks.  Social History   Tobacco Use  Smoking Status Former Smoker  . Quit date: 12/01/2013  . Years since quitting: 6.2  Smokeless Tobacco Never Used    Current Outpatient Medications on File Prior to Visit  Medication Sig Dispense Refill  . Biotin 300 MCG TABS Take by mouth.    . fluticasone (FLONASE) 50 MCG/ACT nasal spray Place into both nostrils daily.    Marland Kitchen levothyroxine (SYNTHROID) 88 MCG tablet TAKE ONE TABLET BY MOUTH EVERY MORNING BEFORE BREAKFAST 90 tablet 0  . Multiple Vitamin (MULTIVITAMIN) tablet Take 1 tablet by mouth daily.    . Ascorbic Acid (VITAMIN C) 100 MG tablet Take 100 mg by mouth daily.    . cholecalciferol (VITAMIN D) 1000 units tablet Take 1,000 Units by mouth daily.    . Cyanocobalamin (VITAMIN B 12 PO) Take by mouth.    . fexofenadine-pseudoephedrine (ALLEGRA-D 24) 180-240 MG 24 hr tablet Take 1 tablet by mouth daily.    . Omega-3 Fatty Acids (FISH OIL) 1000 MG CAPS Take by mouth.     No current facility-administered medications on file prior to visit.     ROS see history of present illness  Objective  Physical Exam Vitals:   02/12/20 1237  BP: 120/80  Pulse: 75  Temp: 97.6 F (36.4 C)  SpO2: 97%    BP Readings from Last 3 Encounters:  02/12/20 120/80  01/24/19 122/73  07/21/18 118/71   Wt Readings from Last 3 Encounters:  02/12/20 134 lb 12.8 oz  (61.1 kg)  01/24/19 133 lb (60.3 kg)  07/21/18 138 lb (62.6 kg)    Physical Exam Constitutional:      General: She is not in acute distress.    Appearance: She is not diaphoretic.  Cardiovascular:     Rate and Rhythm: Normal rate and regular rhythm.     Heart sounds: Normal heart sounds.  Pulmonary:     Effort: Pulmonary effort is normal.     Breath sounds: Normal breath sounds.  Musculoskeletal:        General: No edema.     Right lower leg: No edema.     Left lower leg: No edema.  Skin:    General: Skin is warm and dry.  Neurological:     Mental Status: She is alert.      Assessment/Plan: Please see individual problem list.  Problem List Items Addressed This Visit    Elevated LDL cholesterol level    Check labs.  Continue to stay active and monitor her diet.      Relevant Orders   Comp Met (CMET)   Lipid panel   Hypothyroidism    Check TSH.  Continue Synthroid.      Relevant Orders   TSH    Other Visit Diagnoses    Encounter for screening mammogram for malignant neoplasm  of breast    -  Primary   Relevant Orders   MM 3D SCREEN BREAST BILATERAL       Health Maintenance: Patient was advised to call to schedule her mammogram.  Discussed that she would be due for Cologuard at the end of the month.  Future message sent to myself to place this order at the end of the month.  She was advised to contact us if she does not receive the Cologuard box in the mail sometime in early March.     This visit occurred during the SARS-CoV-2 public health emergency.  Safety protocols were in place, including screening questions prior to the visit, additional usage of staff PPE, and extensive cleaning of exam room while observing appropriate contact time as indicated for disinfecting solutions.    Tommi Rumps, MD [place]

## 2020-02-12 NOTE — Assessment & Plan Note (Signed)
Check labs.  Continue to stay active and monitor her diet.

## 2020-02-25 ENCOUNTER — Encounter: Payer: Self-pay | Admitting: Family Medicine

## 2020-03-07 ENCOUNTER — Telehealth: Payer: Self-pay | Admitting: Family Medicine

## 2020-03-07 DIAGNOSIS — Z1211 Encounter for screening for malignant neoplasm of colon: Secondary | ICD-10-CM

## 2020-03-07 NOTE — Telephone Encounter (Signed)
-----   Message from Glori Luis, MD sent at 02/12/2020 12:47 PM EST ----- Regarding: Cologuard Please place Cologuard order on 03/07/2020

## 2020-03-07 NOTE — Telephone Encounter (Signed)
Cologuard ordered.  Please fax.

## 2020-03-10 ENCOUNTER — Telehealth: Payer: Self-pay

## 2020-03-13 NOTE — Telephone Encounter (Signed)
I looked at it and saw my mistake and I sent it earlier this week.  Demetra Moya,cma

## 2020-03-13 NOTE — Telephone Encounter (Addendum)
Cologuard is due every 3 years. Her last one was completed on 03/07/17, which is now 3 years ago. She should be due. Do you still have the order I signed? If so, can you send it in? If not I can sign a new copy when I am back in the office next week.

## 2020-03-20 LAB — COLOGUARD: Cologuard: NEGATIVE

## 2020-03-27 LAB — COLOGUARD: COLOGUARD: NEGATIVE

## 2020-03-27 LAB — EXTERNAL GENERIC LAB PROCEDURE: COLOGUARD: NEGATIVE

## 2020-04-02 ENCOUNTER — Other Ambulatory Visit: Payer: Self-pay | Admitting: Family Medicine

## 2020-04-04 ENCOUNTER — Telehealth: Payer: Self-pay

## 2020-04-04 NOTE — Telephone Encounter (Signed)
I called and spoke with the patient and informed her of her negative cologuard results and she understood.  Nina,cma

## 2020-06-28 ENCOUNTER — Other Ambulatory Visit: Payer: Self-pay | Admitting: Family Medicine

## 2020-09-23 DIAGNOSIS — H2513 Age-related nuclear cataract, bilateral: Secondary | ICD-10-CM | POA: Diagnosis not present

## 2020-10-06 ENCOUNTER — Other Ambulatory Visit: Payer: Self-pay | Admitting: Family Medicine

## 2020-10-23 IMAGING — MG DIGITAL DIAGNOSTIC BILATERAL MAMMOGRAM WITH TOMO AND CAD
8 series · 8 of 24 positions shown · non-contrast
Comparison: Previous exam(s).

CLINICAL DATA: Two year follow-up of probably benign left breast 2
o'clock mass.

EXAM:
DIGITAL DIAGNOSTIC BILATERAL MAMMOGRAM WITH CAD AND TOMO
ULTRASOUND LEFT BREAST

[L CC synth-2D]
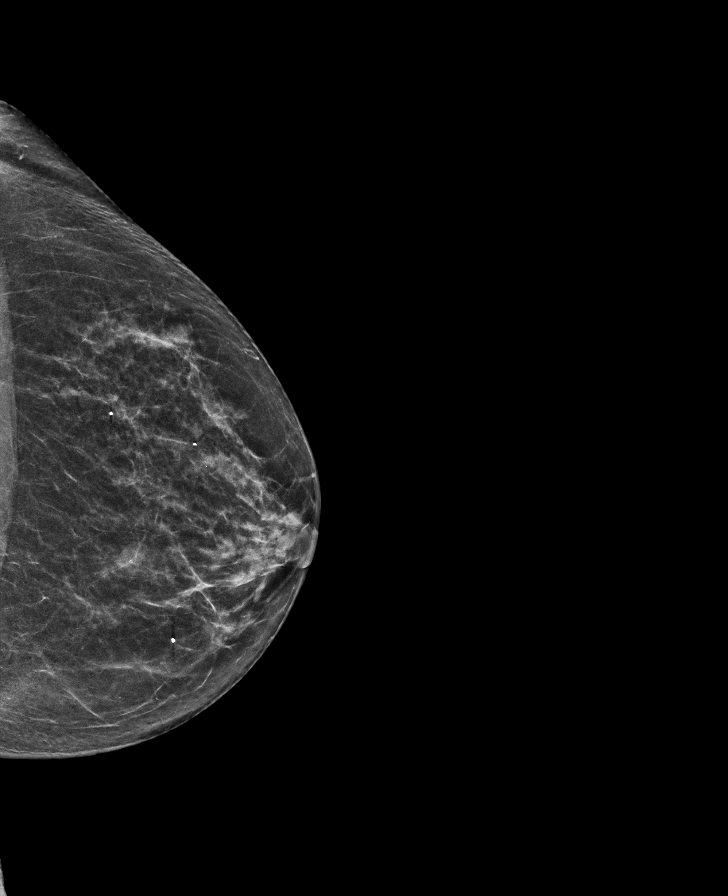

[R MLO synth-2D]
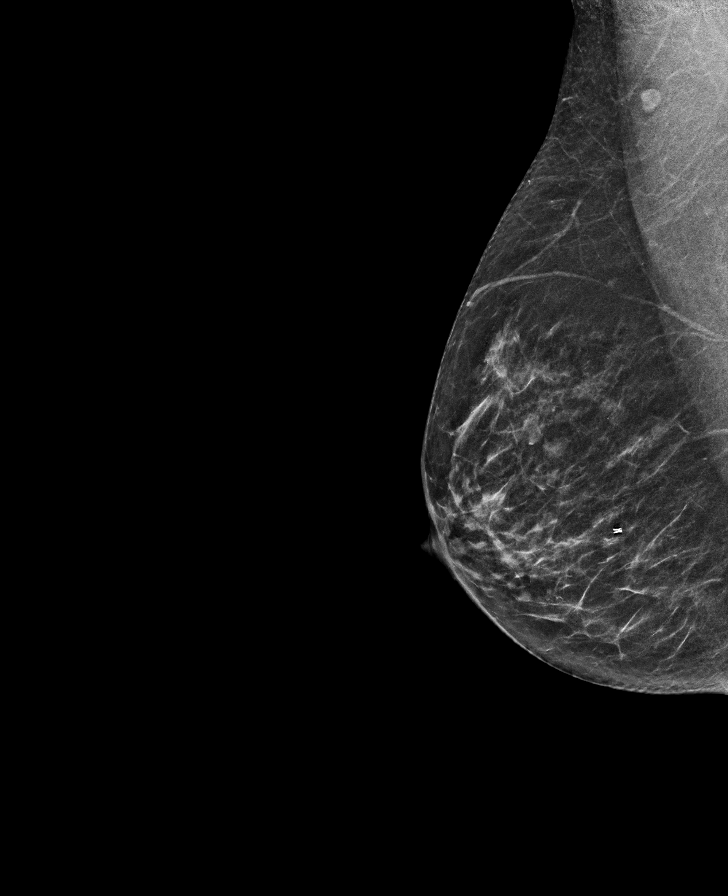

[R CC synth-2D]
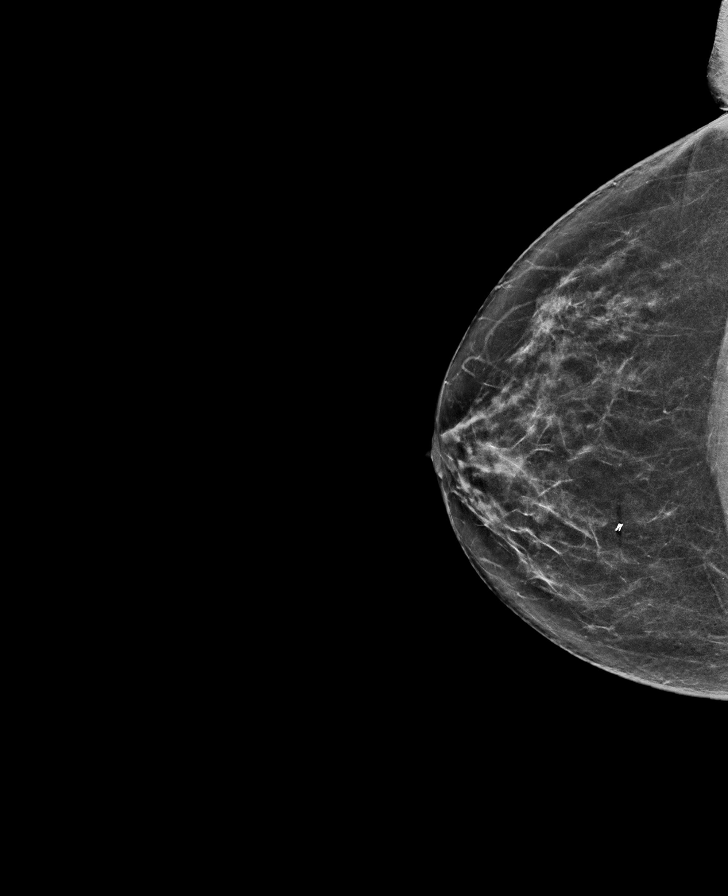

[L MLO synth-2D]
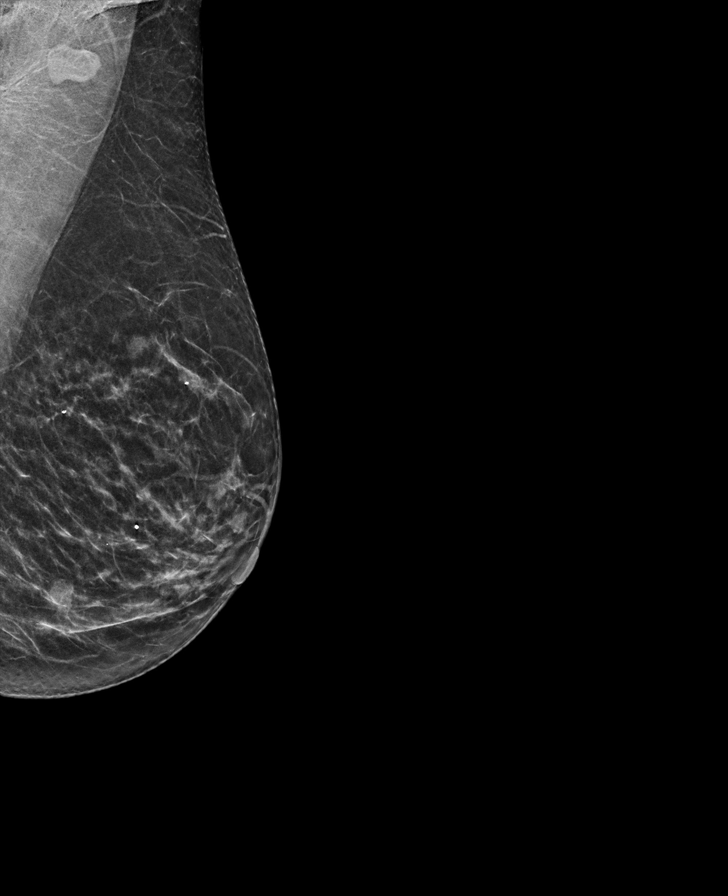

[L MLO tomo · tomo slice 27/54.0]
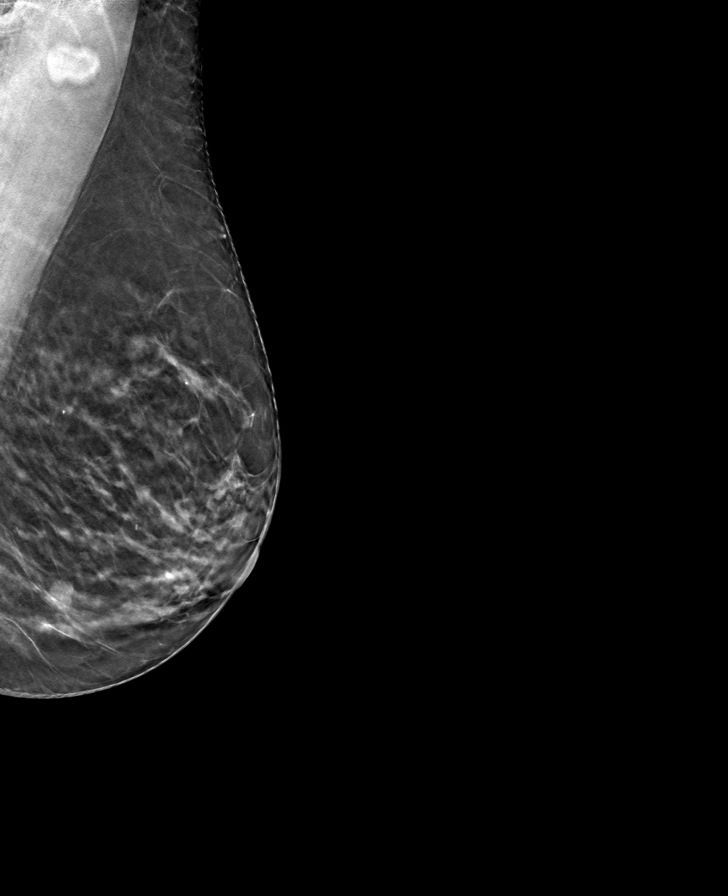

[L CC tomo · tomo slice 29/57.0]
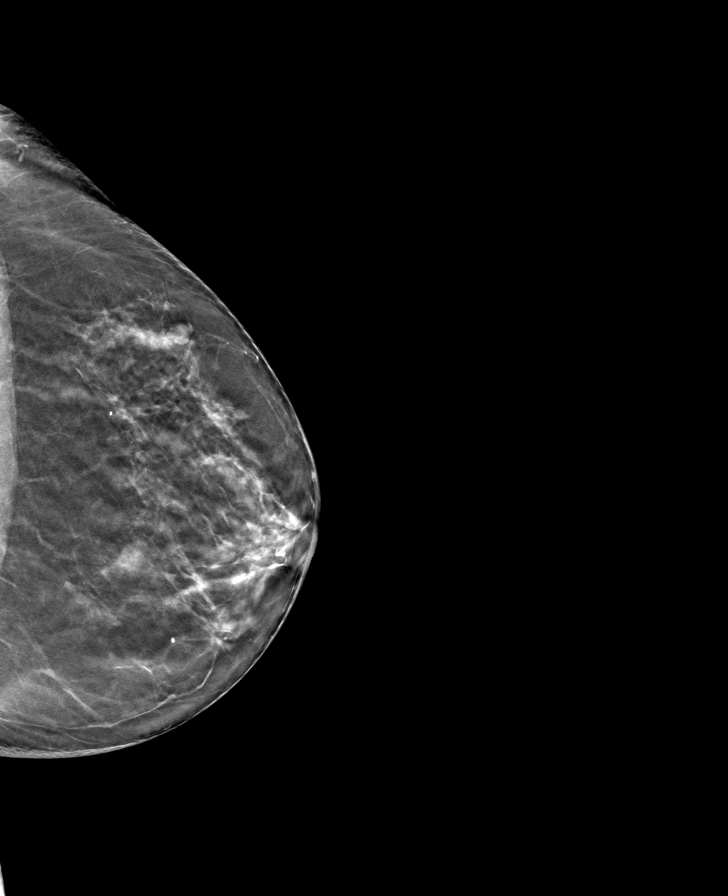

[R CC tomo · tomo slice 31/61.0]
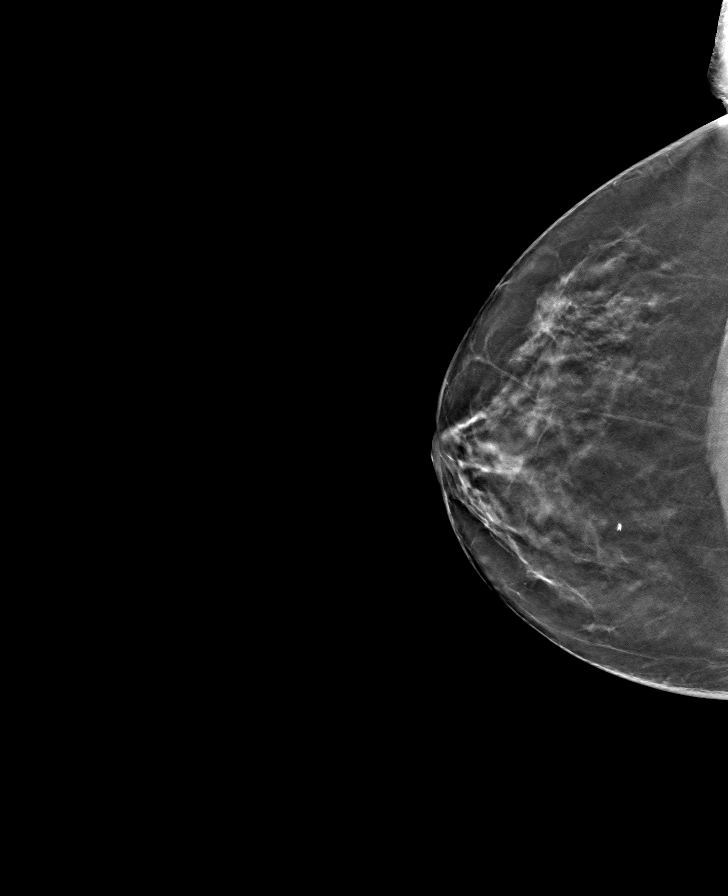

[R MLO tomo · tomo slice 27/54.0]
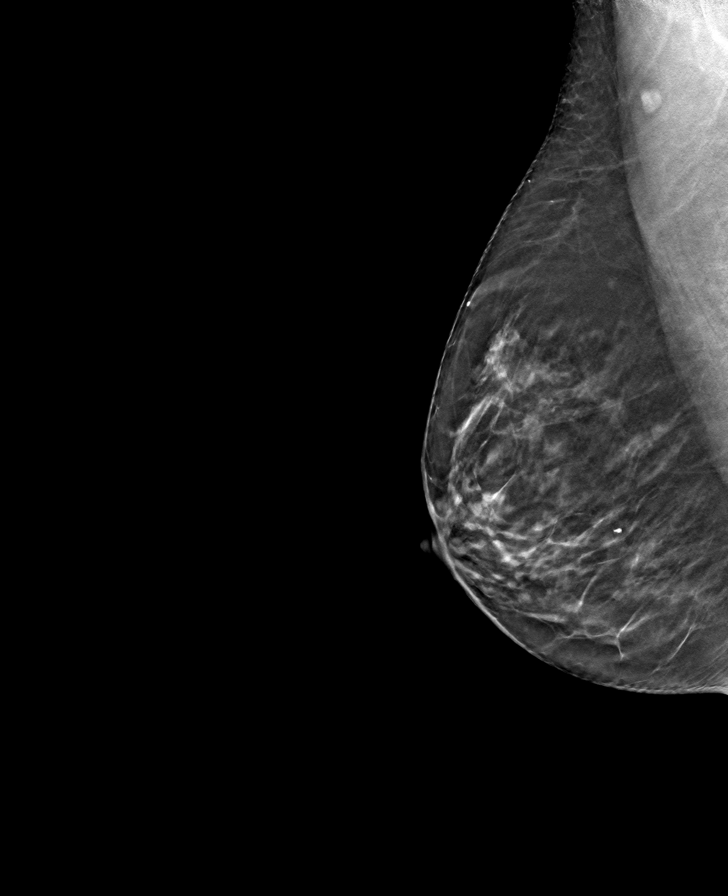

[8 of 24 positions shown; findings below may reference images not displayed]

ACR Breast Density Category b: There are scattered areas of
fibroglandular density.
FINDINGS: Mammographically, there are no suspicious masses, areas of
architectural distortion or microcalcifications in the right breast.

There is a stable circumscribed approximately 5 mm mass in the left
breast upper outer quadrant, middle depth. Additionally, there is a
low-density lobulated mass containing occasional calcifications in
the lower inner left breast, middle depth.

Mammographic images were processed with CAD.

On physical exam, no suspicious masses are palpated.

Targeted left breast ultrasound is performed, showing stable
benign-appearing mass in the left 2 o'clock breast 4 cm from the
nipple measuring 0.6 x 0.4 x 0.6 cm. Its 2 year stability confirms
benign etiology. In the left breast 6 o'clock 2 cm from the nipple
there is a hypoechoic lobulated slightly heterogeneous mass which
measures 1.1 x 0.6 x 0.7 cm. This finding corresponds to the
mammographically seen mass in the lower inner left breast. There is
no evidence of left axillary lymphadenopathy.
IMPRESSION: Left breast 6 o'clock indeterminate mass, for which
ultrasound-guided core needle biopsy is recommended.

Left breast 2 o'clock stable benign-appearing mass, for which no
further specific imaging follow-up is recommended.

No mammographic evidence of right breast malignancy.

RECOMMENDATION:
Ultrasound-guided core needle biopsy of left breast 6 o'clock mass.

I have discussed the findings and recommendations with the patient.
Results were also provided in writing at the conclusion of the
visit. If applicable, a reminder letter will be sent to the patient
regarding the next appointment.

BI-RADS CATEGORY  4: Suspicious.

## 2021-01-04 ENCOUNTER — Other Ambulatory Visit: Payer: Self-pay | Admitting: Family Medicine

## 2021-02-06 ENCOUNTER — Telehealth: Payer: Self-pay | Admitting: Family Medicine

## 2021-02-06 DIAGNOSIS — Z1231 Encounter for screening mammogram for malignant neoplasm of breast: Secondary | ICD-10-CM

## 2021-02-06 NOTE — Telephone Encounter (Signed)
Please contact the patient.  I got a reminder from the records system that she is due for a mammogram.  It also looks like she is due for a yearly physical.  If she is willing I can place the order for the mammogram and she can be scheduled for her yearly physical.  Thanks.

## 2021-02-06 NOTE — Telephone Encounter (Signed)
Noted.  Mammogram ordered.  She can call [facility] to get this scheduled.

## 2021-02-06 NOTE — Addendum Note (Signed)
Addended by: Birdie Sons Jalon Blackwelder G on: 02/06/2021 12:25 PM   Modules accepted: Orders

## 2021-02-06 NOTE — Telephone Encounter (Signed)
I called the patient and she is ok with the order for the mammogram and she is scheduled for her yearly physical. Igor Bishop,cma

## 2021-03-19 ENCOUNTER — Other Ambulatory Visit: Payer: Self-pay

## 2021-03-19 ENCOUNTER — Other Ambulatory Visit (INDEPENDENT_AMBULATORY_CARE_PROVIDER_SITE_OTHER): Payer: Medicare Other

## 2021-03-19 DIAGNOSIS — E039 Hypothyroidism, unspecified: Secondary | ICD-10-CM

## 2021-03-19 DIAGNOSIS — I10 Essential (primary) hypertension: Secondary | ICD-10-CM | POA: Diagnosis not present

## 2021-03-19 DIAGNOSIS — E7800 Pure hypercholesterolemia, unspecified: Secondary | ICD-10-CM | POA: Diagnosis not present

## 2021-03-19 LAB — LIPID PANEL
Cholesterol: 203 mg/dL — ABNORMAL HIGH (ref 0–200)
HDL: 45.9 mg/dL (ref >39.00)
NonHDL: 156.94
Total CHOL/HDL Ratio: 4
Triglycerides: 350 mg/dL — ABNORMAL HIGH (ref 0.0–149.0)
VLDL: 70 mg/dL — ABNORMAL HIGH (ref 0.0–40.0)

## 2021-03-19 LAB — COMPREHENSIVE METABOLIC PANEL
ALT: 16 U/L (ref 0–35)
AST: 17 U/L (ref 0–37)
Albumin: 4.4 g/dL (ref 3.5–5.2)
Alkaline Phosphatase: 73 U/L (ref 39–117)
BUN: 15 mg/dL (ref 6–23)
CO2: 30 mEq/L (ref 19–32)
Calcium: 9.5 mg/dL (ref 8.4–10.5)
Chloride: 104 mEq/L (ref 96–112)
Creatinine, Ser: 0.72 mg/dL (ref 0.40–1.20)
GFR: 87.46 mL/min (ref >60.00)
Glucose, Bld: 65 mg/dL — ABNORMAL LOW (ref 70–99)
Potassium: 4.3 mEq/L (ref 3.5–5.1)
Sodium: 140 mEq/L (ref 135–145)
Total Bilirubin: 0.3 mg/dL (ref 0.2–1.2)
Total Protein: 6.3 g/dL (ref 6.0–8.3)

## 2021-03-19 LAB — CBC
HCT: 40.7 % (ref 36.0–46.0)
Hemoglobin: 13.7 g/dL (ref 12.0–15.0)
MCHC: 33.8 g/dL (ref 30.0–36.0)
MCV: 85.4 fl (ref 78.0–100.0)
Platelets: 226 10*3/uL (ref 150.0–400.0)
RBC: 4.76 Mil/uL (ref 3.87–5.11)
RDW: 13.2 % (ref 11.5–15.5)
WBC: 4.9 10*3/uL (ref 4.0–10.5)

## 2021-03-19 LAB — LDL CHOLESTEROL, DIRECT: Direct LDL: 141 mg/dL

## 2021-03-19 LAB — TSH: TSH: 1.18 u[IU]/mL (ref 0.35–5.50)

## 2021-03-24 ENCOUNTER — Other Ambulatory Visit (HOSPITAL_COMMUNITY)
Admission: RE | Admit: 2021-03-24 | Discharge: 2021-03-24 | Disposition: A | Discharge status: Home / Self Care | Payer: Medicare Other | Source: Ambulatory Visit | Attending: Family Medicine | Admitting: Family Medicine

## 2021-03-24 ENCOUNTER — Encounter: Payer: Self-pay | Admitting: Family Medicine

## 2021-03-24 ENCOUNTER — Other Ambulatory Visit: Payer: Self-pay

## 2021-03-24 ENCOUNTER — Ambulatory Visit (INDEPENDENT_AMBULATORY_CARE_PROVIDER_SITE_OTHER): Payer: Medicare Other

## 2021-03-24 ENCOUNTER — Ambulatory Visit: Payer: Medicare Other | Admitting: Family Medicine

## 2021-03-24 VITALS — BP 120/80 | HR 71 | Temp 97.9°F | Ht 65.0 in | Wt 136.6 lb

## 2021-03-24 DIAGNOSIS — Z780 Asymptomatic menopausal state: Secondary | ICD-10-CM

## 2021-03-24 DIAGNOSIS — Z0001 Encounter for general adult medical examination with abnormal findings: Secondary | ICD-10-CM | POA: Diagnosis not present

## 2021-03-24 DIAGNOSIS — E039 Hypothyroidism, unspecified: Secondary | ICD-10-CM

## 2021-03-24 DIAGNOSIS — Z124 Encounter for screening for malignant neoplasm of cervix: Secondary | ICD-10-CM

## 2021-03-24 DIAGNOSIS — M47812 Spondylosis without myelopathy or radiculopathy, cervical region: Secondary | ICD-10-CM | POA: Diagnosis not present

## 2021-03-24 DIAGNOSIS — G8929 Other chronic pain: Secondary | ICD-10-CM

## 2021-03-24 DIAGNOSIS — E7800 Pure hypercholesterolemia, unspecified: Secondary | ICD-10-CM | POA: Diagnosis not present

## 2021-03-24 DIAGNOSIS — M542 Cervicalgia: Secondary | ICD-10-CM

## 2021-03-24 DIAGNOSIS — I10 Essential (primary) hypertension: Secondary | ICD-10-CM

## 2021-03-24 DIAGNOSIS — J309 Allergic rhinitis, unspecified: Secondary | ICD-10-CM | POA: Diagnosis not present

## 2021-03-24 DIAGNOSIS — Z01419 Encounter for gynecological examination (general) (routine) without abnormal findings: Secondary | ICD-10-CM | POA: Diagnosis present

## 2021-03-24 DIAGNOSIS — Z1151 Encounter for screening for human papillomavirus (HPV): Secondary | ICD-10-CM | POA: Diagnosis not present

## 2021-03-24 MED ORDER — LEVOCETIRIZINE DIHYDROCHLORIDE 5 MG PO TABS: 2.5000 mg | ORAL_TABLET | Freq: Every evening | ORAL | 1 refills | Status: DC

## 2021-03-24 MED ORDER — MOMETASONE FUROATE 50 MCG/ACT NA SUSP: 2.0000 | Freq: Every day | NASAL | 12 refills | Status: DC

## 2021-03-24 NOTE — Assessment & Plan Note (Signed)
This is a chronic issue.  We will switch her to Nasonex and Xyzal.  If she is not improving she will let us know and I will refer her to ENT. ?

## 2021-03-24 NOTE — Patient Instructions (Addendum)
Nice to see you. ?We will get an x-ray today. ?We will switch you over to Nasonex and Xyzal to see if that helps with your allergies.  If this does not help with your allergies please let me know and we can refer you to ENT. ?Please call [phone] to schedule your mammogram and bone density test. ?

## 2021-03-24 NOTE — Assessment & Plan Note (Signed)
Discussed intermediate risk ASCVD risk score.  Discussed the options were to work on diet and exercise versus start of statin.  She opts to work on diet and exercise.  We will check this in 1 year. ?

## 2021-03-24 NOTE — Assessment & Plan Note (Signed)
Physical exam completed.  Encouraged healthy diet and exercise.  She will call to schedule her mammogram and bone density scans.  Pap smear completed today.  We will confirm her vaccine status on her pneumonia vaccine.  Lab work reviewed with the patient. ?

## 2021-03-24 NOTE — Progress Notes (Signed)
Marikay Alar, MD Phone: [phone]  Meghan Higgins is a 66 y.o. female who presents today for cpe.  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  Results in daily sinus headaches for many decades.  She notes postnasal drip that has worsened down the right posterior oropharynx.  She notes no significant sinus congestion.  She takes Sports coach with little benefit.  Chronic neck pain/right fingertip numbness: Patient reports chronic issues with right-sided neck discomfort.  She notes a pulling sensation in that area since having neck discomfort and headache a week ago.  She notes some numbness intermittently on the tips of her right third and fourth fingers as well as occasionally her second finger.  Notes the numbness does not occur every day.  The 10-year ASCVD risk score (Arnett DK, et al., 2019) is: 5.4%   Values used to calculate the score:     Age: 58 years     Sex: Female     Is Non-Hispanic African American: No     Diabetic: No     Tobacco smoker: No     Systolic Blood Pressure: 120 mmHg     Is BP treated: No     HDL Cholesterol: 45.9 mg/dL     Total Cholesterol: 203 mg/dL   Active Ambulatory Problems    Diagnosis Date Noted   Hypothyroidism 12/02/2014   Encounter for general adult medical examination with abnormal findings 01/01/2016   Abnormal mammogram 02/24/2017   Allergic rhinitis  02/24/2017   Elevated LDL cholesterol level 07/21/2018   Chronic neck pain 03/24/2021   Resolved Ambulatory Problems    Diagnosis Date Noted   Encounter to establish care 12/02/2014   Sinus headache 12/02/2014   Acute bronchitis 11/06/2015   Chigger bites 07/21/2018   Past Medical History:  Diagnosis Date   Allergy    Arthritis    Frequent headaches    Thyroid disease     Family History  Problem Relation Age of Onset   Cancer Father    Heart disease Father    Stroke Father    Diabetes Father    Diabetes Sister    Cancer Sister    Cancer Maternal Grandmother    Breast cancer Maternal Grandmother     Social History   Socioeconomic History   Marital status: Single    Spouse name: Not on file   Number of children: Not on file   Years of education: Not on file   Highest education level: Not on file  Occupational History   Not on file  Tobacco Use   Smoking status: Former    Types: Cigarettes    Quit date: 12/01/2013    Years since quitting: 7.3   Smokeless tobacco: Never  Substance and Sexual Activity   Alcohol use: No    Alcohol/week: 0.0 standard drinks   Drug use: No   Sexual activity: Never  Other Topics Concern   Not on file  Social  History Narrative   Single   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    Caffeine- Coffee 1 cup in the am and tea 20 oz throughout the days, no soda, uses splenda          Social Determinants of Health   Financial Resource Strain: Not on file  Food Insecurity: Not on file  Transportation Needs: Not on file  Physical Activity: Not on file  Stress: Not on file  Social Connections: Not on file  Intimate Partner Violence: Not on file    ROS  General:  Negative for nexplained weight loss, fever Skin: Negative for new or changing mole, sore that won't heal HEENT: Negative for trouble hearing, trouble seeing, ringing in ears, mouth sores, hoarseness, change in voice, dysphagia. CV:   Negative for chest pain, dyspnea, edema, palpitations Resp: Negative for cough, dyspnea, hemoptysis GI: Negative for nausea, vomiting, diarrhea, constipation, abdominal pain, melena, hematochezia. GU: Negative for dysuria, incontinence, urinary hesitance, hematuria, vaginal or penile discharge, polyuria, sexual difficulty, lumps in testicle or breasts MSK: Positive for joint pain/swelling, negative for muscle cramps or aches Neuro: Positive for numbness, headaches, negative for weakness, dizziness, passing out/fainting Psych: Negative for depression, anxiety, memory problems  Objective  Physical Exam Vitals:   03/24/21 0953  BP: 120/80  Pulse: 71  Temp: 97.9 F (36.6 C)  SpO2: 97%    BP Readings from Last 3 Encounters:  03/24/21 120/80  02/12/20 120/80  01/24/19 122/73   Wt Readings from Last 3 Encounters:  03/24/21 136 lb 9.6 oz (62 kg)  02/12/20 134 lb 12.8 oz (61.1 kg)  01/24/19 133 lb (60.3 kg)    Physical Exam Constitutional:      General: She is not in acute distress.    Appearance: She is not diaphoretic.  HENT:     Head: Normocephalic and atraumatic.     Right Ear: Tympanic membrane normal.     Left Ear: Tympanic membrane normal.  Schiffman:     Conjunctiva/sclera: Conjunctivae normal.     Pupils: Pupils are equal, round, and reactive to light.  Cardiovascular:     Rate and Rhythm: Normal rate and regular rhythm.     Heart sounds: Normal heart sounds.  Pulmonary:     Effort: Pulmonary effort is normal.     Breath sounds: Normal breath sounds.  Abdominal:     General: Bowel sounds are normal. There is no distension.     Palpations: Abdomen is soft.     Tenderness: There is no abdominal tenderness.  Genitourinary:    Comments: Charlyne Mom, CMA served as chaperone, normal labia and external genitalia, atrophic vaginal mucosa, normal-appearing cervix, no cervical motion tenderness, no adnexal tenderness or masses Musculoskeletal:     Right lower leg: No edema.      Left lower leg: No edema.  Lymphadenopathy:     Cervical: No cervical adenopathy.  Skin:    General: Skin is warm and dry.  Neurological:     Mental Status: She is alert.     Comments: CN 3-12 intact, 5/5 strength in bilateral biceps, triceps, grip, quads, hamstrings, plantar and dorsiflexion, sensation to light touch intact in bilateral UE and LE, normal gait  Psychiatric:        Mood and Affect: Mood normal.     Assessment/Plan:   Problem List Items Addressed This Visit     Allergic rhinitis    This is a chronic issue.  We will switch her to Nasonex and Xyzal.  If she is not improving she will let us know and I will refer her to ENT.      Relevant Medications   mometasone (NASONEX) 50 MCG/ACT nasal spray   levocetirizine (XYZAL) 5 MG tablet   Chronic neck pain    Chronic issue with some numbness in the tips of her second third and fourth fingers on the right side.  We will get an x-ray today and then determine if the neck step would be physical therapy versus some other evaluation.      Relevant Orders   DG Cervical Spine Complete   Elevated LDL cholesterol level    Discussed intermediate risk ASCVD risk score.  Discussed the options were to work on diet and exercise versus start of statin.  She opts to work on diet and exercise.  We will check this in 1 year.      Relevant Orders   Lipid panel (Completed)   Encounter for general adult medical examination with abnormal findings - Primary    Physical exam completed.  Encouraged healthy diet and exercise.  She will call to schedule her mammogram and bone density scans.  Pap smear completed today.  We will confirm her vaccine status on her pneumonia vaccine.  Lab work reviewed with the patient.      Hypothyroidism   Relevant Orders   TSH (Completed)   Other Visit Diagnoses     Essential hypertension       Relevant Orders   CBC (Completed)   Comp Met (CMET) (Completed)   Cervical cancer screening       Relevant  Orders   Cytology - PAP( [place])   Postmenopausal estrogen deficiency       Relevant Orders   DG Bone Density       Return in about 1 year (around 03/25/2022) for CPE.  This visit occurred during the SARS-CoV-2 public health emergency.  Safety protocols were in place, including screening questions prior to the visit, additional usage of staff PPE, and extensive cleaning of exam room while observing appropriate contact time as indicated for disinfecting solutions.    Marikay Alar, MD [facility]

## 2021-03-24 NOTE — Assessment & Plan Note (Signed)
Chronic issue with some numbness in the tips of her second third and fourth fingers on the right side.  We will get an x-ray today and then determine if the neck step would be physical therapy versus some other evaluation. ?

## 2021-03-25 LAB — CYTOLOGY - PAP
Comment: NEGATIVE
Diagnosis: NEGATIVE
High risk HPV: NEGATIVE

## 2021-04-01 ENCOUNTER — Other Ambulatory Visit: Payer: Self-pay | Admitting: Family Medicine

## 2021-04-01 DIAGNOSIS — M5412 Radiculopathy, cervical region: Secondary | ICD-10-CM

## 2021-04-01 DIAGNOSIS — G8929 Other chronic pain: Secondary | ICD-10-CM

## 2021-04-06 ENCOUNTER — Other Ambulatory Visit: Payer: Self-pay | Admitting: Family Medicine

## 2021-04-06 ENCOUNTER — Telehealth: Payer: Self-pay | Admitting: Physical Therapy

## 2021-04-06 NOTE — Telephone Encounter (Signed)
Called pt to inquire if she wanted to move her apt to Monday instead of tomorrow. Pt stated that she was unable to, because she had to work the rest of today.  ?

## 2021-04-07 ENCOUNTER — Encounter: Payer: Self-pay | Admitting: Physical Therapy

## 2021-04-07 ENCOUNTER — Other Ambulatory Visit: Payer: Self-pay

## 2021-04-07 ENCOUNTER — Ambulatory Visit: Payer: Medicare Other | Attending: Family Medicine | Admitting: Physical Therapy

## 2021-04-07 DIAGNOSIS — M5412 Radiculopathy, cervical region: Secondary | ICD-10-CM | POA: Diagnosis not present

## 2021-04-07 DIAGNOSIS — G8929 Other chronic pain: Secondary | ICD-10-CM | POA: Diagnosis not present

## 2021-04-07 DIAGNOSIS — M542 Cervicalgia: Secondary | ICD-10-CM | POA: Insufficient documentation

## 2021-04-07 NOTE — Therapy (Signed)
?OUTPATIENT PHYSICAL THERAPY CERVICAL EVALUATION ? ? ?Patient Name: Meghan Higgins ?MRN: [id number] ?DOB:Jan 09, 1956, 66 y.o., female ?Today's Date: 04/07/2021 ? ? PT End of Session - 04/07/21 2017   ? ? Visit Number 1   ? Number of Visits 10   ? Date for PT Re-Evaluation 06/16/21   ? Authorization Type [facility] Medicare 2023   ? Progress Note Due on Visit 10   ? PT Start Time 1415   ? PT Stop Time 1500   ? PT Time Calculation (min) 45 min   ? Activity Tolerance Patient tolerated treatment well   ? Behavior During Therapy [facility] for tasks assessed/performed   ? ?  ?  ? ?  ? ? ?Past Medical History:  ?Diagnosis Date  ? Allergy   ? Arthritis   ? Frequent headaches   ? Thyroid disease   ? ?Past Surgical History:  ?Procedure Laterality Date  ? APPENDECTOMY  1967  ? ?Patient Active Problem List  ? Diagnosis Date Noted  ? Chronic neck pain 03/24/2021  ? Elevated LDL cholesterol level 07/21/2018  ? Abnormal mammogram 02/24/2017  ? Allergic rhinitis 02/24/2017  ? Encounter for general adult medical examination with abnormal findings 01/01/2016  ? Hypothyroidism 12/02/2014  ? ? ?PCP: Glori Luis, MD ? ?REFERRING PROVIDER: Glori Luis, MD ? ?REFERRING DIAG: Cervicalgia ? ?THERAPY DIAG:  ?Cervicalgia ? ?ONSET DATE: 03/11/2016 ? ?SUBJECTIVE:                                                                                                                                                                                                        ? ?SUBJECTIVE STATEMENT: ?Pt reports that she can feel neck pain all the way from the back of her skull down to her hand. She reports neck and finger pain have been going on for years. The pulling in the back of her head started about a month a go after having a severe headache. 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 ? ?PERTINENT HISTORY:  ?Dr.  Birdie Sons 03/24/21 ? ?Chronic neck pain/right fingertip numbness: Patient reports chronic issues with right-sided neck discomfort.  She notes a pulling sensation in that area since having neck discomfort and headache a week ago.  She notes some numbness intermittently on the tips of her right third and fourth fingers as well as occasionally her second finger.  Notes the numbness does not  occur every day. ? ?PAIN:  ?Are you having pain? Yes: NPRS scale: 7/10 ?Pain location: Right sided upper trap from occiput to right shoulder  ?Pain description: Achy to sharp ?Aggravating factors: No real clear thing that  ?Relieving factors: Usually feels better while not working or using UE excessively  ? ?PRECAUTIONS: None ? ?WEIGHT BEARING RESTRICTIONS No  ? ?FALLS:  ?Has patient fallen in last 6 months? No ? ?LIVING ENVIRONMENT: ?Lives with: lives with their family ?Lives in: House/apartment ?Stairs: Yes: External: 2 steps; on right going up ?Has following equipment at home: None ? ?OCCUPATION: Therapist, sportsizza Maker at [employer]  ? ?PLOF: Independent ? ?PATIENT GOALS Manage neck pain or completely get rid of the pain in her neck  ? ?OBJECTIVE:  ? ?VITALS: BP 131/80 HR 84 SpO2 100 ? ? ?Cervical Red Flags: Denies all of the following red flags  ? ?Dizziness, Diplopia, Drop Attacks (loss of power or consciousness), Dysphagia, Dysarthria, Nystagmus, Nausea or vomiting, and other neurological symptoms  ? ? ?DIAGNOSTIC FINDINGS:  ?CLINICAL DATA:  Chronic neck pain. Right hand second, third and ?fourth digit numbness at fingertips. ?  ?EXAM: ?CERVICAL SPINE - COMPLETE 4+ VIEW ?  ?COMPARISON:  None. ?  ?FINDINGS: ?There is no evidence of cervical spine fracture or prevertebral soft ?tissue swelling. Alignment is normal. There is straightening of ?normal cervical lordosis. There are mild degenerative endplate ?changes throughout the cervical spine from C3 through C7 with disc ?space narrowing and endplate osteophyte formation. ?   ?IMPRESSION: ?1. No acute bony abnormality. ?2. Mild/moderate multilevel degenerative changes. ?  ?  ?Electronically Signed ?  By: Darliss CheneyAmy  Guttmann M.D. ?  On: 03/25/2021 16:39 ? ?PATIENT SURVEYS:  ?FOTO 51/61 ? ? ?COGNITION: ?Overall cognitive status: Within functional limits for tasks assessed ? ? ?SENSATION: ?WFL ? ?POSTURE:  ?Upright posture, no forward flexed posture  ? ?PALPATION: ?Right side of occipital bone is TTP along with span of muscle down to right clavicle and AC joint.  ? ?CERVICAL ROM:  ? ?Active ROM A/PROM (deg) ?04/07/2021  ?Flexion 30  ?Extension 30  ?Right lateral flexion 30  ?Left lateral flexion 25  ?Right rotation 45  ?Left rotation 45  ? (Blank rows = not tested) ? ? ?Passive ROM A/PROM (deg) ?04/07/2021  ?Flexion 45  ?Extension 45  ?Right lateral flexion 45  ?Left lateral flexion 45  ?Right rotation 60  ?Left rotation 60  ? (Blank rows = not tested) ? ?UE ROM: ? ?Active ROM Right ?04/07/2021 Left ?04/07/2021  ?Shoulder flexion 180 180  ?Shoulder extension 60 60  ?Shoulder abduction 180 180  ?Shoulder adduction    ?Shoulder extension    ?Shoulder internal rotation 70 70  ?Shoulder external rotation 90 90  ?Elbow flexion 150 150  ?Elbow extension 0 0  ?Wrist flexion 80 80  ?Wrist extension 70 70  ?Wrist ulnar deviation 30 30  ?Wrist radial deviation 20 20  ?Wrist pronation 80 80  ?Wrist supination 80 80  ? (Blank rows = not tested) ? ? ? ? ? ? ?UE MMT: ? ?MMT Right ?04/07/2021 Left ?04/07/2021  ?Shoulder flexion 5/5 5/5  ?Shoulder extension 5/5 5/5  ?Shoulder abduction 5/5 5/5  ?Shoulder adduction    ?Shoulder extension 5/5 5/5  ?Shoulder internal rotation    ?Shoulder external rotation    ?Middle trapezius 3+/5 3+/5  ?Lower trapezius 3+/5 3+/5  ?Elbow flexion 5/5 5/5  ?Elbow extension 5/5 5/5  ?Wrist flexion 5/5 5/5  ?Wrist extension 5/5 5/5  ?Wrist ulnar  deviation    ?Wrist radial deviation    ?Wrist pronation    ?Wrist supination    ?Grip strength Good  Good  ? (Blank rows = not  tested) ? ?CERVICAL SPECIAL TESTS:  ?Cranial cervical flexion test: Negative and Spurling's test: Negative ? ? ?PATIENT SURVEYS:  ?FOTO 51/61 ? ?TODAY'S TREATMENT:  ?04/07/21 ?Upper Trap Stretch 3 x 60 sec  ? ? ?PATIENT EDUCATION:  ?Education details: form and technique for appropriate exercise  ?Person educated: Patient ?Education method: Explanation, Demonstration, Verbal cues, and Handouts ?Education comprehension: verbalized understanding and returned demonstration ? ? ?HOME EXERCISE PROGRAM: ?Access Code: [email or web address] ?URL: [email or web address] ?Date: 04/07/2021 ?Prepared by: Ellin Goodie ? ?Exercises ?- Seated Gentle Upper Trapezius Stretch  - 1 x daily - 7 x weekly - 1 sets - 3 reps - 60 hold ? ?ASSESSMENT: ? ?CLINICAL IMPRESSION: ?Patient is a 65 y.o. white female who was seen today for physical therapy evaluation and treatment for neck pain and numbness in tingling in fingers in right hand. She demonstrates decreased cervical AROM and parascapular strength along with cervical pain that is localized in right side of upper trap.Cervical radiculopathy ruled out during examination and numbness and tingling are localized to middle and ring fingers on right side of her hand. She will benefit from skilled PT to decrease right sided neck pain and to increase parascapular strength to reliable perform job related duties without pain or discomfort.  ? ? ?OBJECTIVE IMPAIRMENTS decreased ROM, decreased strength, and pain.  ? ?ACTIVITY LIMITATIONS occupation.  ? ?PERSONAL FACTORS Age, Past/current experiences, and Profession are also affecting patient's functional outcome.  ? ? ?REHAB POTENTIAL: Excellent ? ?CLINICAL DECISION MAKING: Stable/uncomplicated ? ?EVALUATION COMPLEXITY: Low ? ? ?GOALS: ?Goals reviewed with patient? Yes ? ?SHORT TERM GOALS: Target date: 04/21/2021 ? ?Pt will be independent with HEP in order to improve strength and balance in order to decrease fall risk and improve function at home and  work. ?Baseline: NT  ?Goal status: INITIAL ? ? ? ?LONG TERM GOALS: Target date: 06/16/2021 ? ?Patient will have improved function and activity level as evidenced by an increase in FOTO score by 10 points or more to demonstrate improved

## 2021-04-09 ENCOUNTER — Encounter: Payer: Self-pay | Admitting: Physical Therapy

## 2021-04-09 ENCOUNTER — Ambulatory Visit: Payer: Medicare Other | Admitting: Physical Therapy

## 2021-04-09 DIAGNOSIS — M5412 Radiculopathy, cervical region: Secondary | ICD-10-CM | POA: Diagnosis not present

## 2021-04-09 DIAGNOSIS — G8929 Other chronic pain: Secondary | ICD-10-CM | POA: Diagnosis not present

## 2021-04-09 DIAGNOSIS — M542 Cervicalgia: Secondary | ICD-10-CM

## 2021-04-09 NOTE — Therapy (Signed)
?OUTPATIENT PHYSICAL THERAPY TREATMENT NOTE ? ? ?Patient Name: Meghan Higgins ?MRN: [id number] ?DOB:[birth year], 66 y.o., female ?Today's Date: 04/09/2021 ? ?PCP: Glori LuisSonnenberg, Eric G, MD ?REFERRING PROVIDER: Glori LuisSonnenberg, Eric G, MD ? ? PT End of Session - 04/09/21 1152   ? ? Visit Number 2   ? Number of Visits 10   ? Date for PT Re-Evaluation 06/16/21   ? [facility] Type [facility]   ? Progress Note Due on Visit 10   ? PT Start Time 1150   ? PT Stop Time 1230   ? PT Time Calculation (min) 40 min   ? Activity Tolerance Patient tolerated treatment well   ? [facility] assessed/performed   ? ?  ?  ? ?  ? ? ?Past Medical History:  ?Diagnosis Date  ? Allergy   ? Arthritis   ? Frequent headaches   ? Thyroid disease   ? ?Past Surgical History:  ?Procedure Laterality Date  ? APPENDECTOMY  1967  ? ?Patient Active Problem List  ? Diagnosis Date Noted  ? Chronic neck pain 03/24/2021  ? Elevated LDL cholesterol level 07/21/2018  ? Abnormal mammogram 02/24/2017  ? Allergic rhinitis 02/24/2017  ? Encounter for general adult medical examination with abnormal findings 01/01/2016  ? Hypothyroidism 12/02/2014  ? ? ?REFERRING DIAG: Cervicalgia  ? ?THERAPY DIAG:  ?Cervicalgia ? ?PERTINENT HISTORY: Dr. Birdie SonsSonnenberg 03/24/21 ?  ?Chronic neck pain/right fingertip numbness: Patient reports chronic issues with right-sided neck discomfort.  She notes a pulling sensation in that area since having neck discomfort and headache a week ago.  She notes some numbness intermittently on the tips of her right third and fourth fingers as well as occasionally her second finger.  Notes the numbness does not occur every day ? ?PRECAUTIONS: None  ? ?SUBJECTIVE: Pt reports improved symptom response since performing upper trap stretch. 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  ? ?PAIN:  ?Are you having pain? No ? ?OBJECTIVE:  ?  ?VITALS: BP 131/80 HR 84 SpO2 100 ?  ?   ?Cervical Red Flags: Denies all of the following red flags  ?  ?Dizziness, Diplopia, Drop Attacks (loss of power or consciousness), Dysphagia, Dysarthria, Nystagmus, Nausea or vomiting, and other neurological symptoms  ?  ?  ?DIAGNOSTIC FINDINGS:  ?CLINICAL DATA:  Chronic neck pain. Right hand second, third and ?fourth digit numbness at fingertips. ?  ?EXAM: ?CERVICAL SPINE - COMPLETE 4+ VIEW ?  ?COMPARISON:  None. ?  ?FINDINGS: ?There is no evidence of cervical spine fracture or prevertebral soft ?tissue swelling. Alignment is normal. There is straightening of ?normal cervical lordosis. There are mild degenerative endplate ?changes throughout the cervical spine from C3 through C7 with disc ?space narrowing and endplate osteophyte formation. ?  ?IMPRESSION: ?1. No acute bony abnormality. ?2. Mild/moderate multilevel degenerative changes. ?  ?  ?Electronically Signed ?  By: Darliss CheneyAmy  Guttmann M.D. ?  On: 03/25/2021 16:39 ?  ?PATIENT SURVEYS:  ?FOTO 51/61 ?  ?  ?COGNITION: ?Overall cognitive status: Within functional limits for tasks assessed ?  ?  ?SENSATION: ?WFL ?  ?POSTURE:  ?Upright posture, no forward flexed posture  ?  ?PALPATION: ?Right side of occipital bone is TTP along with span of muscle down to right clavicle and AC joint.         ?  ?CERVICAL ROM:  ?  ?Active ROM A/PROM (deg) ?04/07/2021  ?Flexion 30  ?  Extension 30  ?Right lateral flexion 30  ?Left lateral flexion 25  ?Right rotation 45  ?Left rotation 45  ? (Blank rows = not tested) ?  ?  ?Passive ROM A/PROM (deg) ?04/07/2021  ?Flexion 45  ?Extension 45  ?Right lateral flexion 45  ?Left lateral flexion 45  ?Right rotation 60  ?Left rotation 60  ? (Blank rows = not tested) ?  ?UE ROM: ?  ?Active ROM Right ?04/07/2021 Left ?04/07/2021  ?Shoulder flexion 180 180  ?Shoulder extension 60 60  ?Shoulder abduction 180 180  ?Shoulder adduction      ?Shoulder extension      ?Shoulder internal rotation 70 70  ?Shoulder external rotation 90 90  ?Elbow flexion 150 150   ?Elbow extension 0 0  ?Wrist flexion 80 80  ?Wrist extension 70 70  ?Wrist ulnar deviation 30 30  ?Wrist radial deviation 20 20  ?Wrist pronation 80 80  ?Wrist supination 80 80  ? (Blank rows = not tested) ?  ?  ? ?  ?  ?  ?UE MMT: ?  ?MMT Right ?04/07/2021 Left ?04/07/2021  ?Shoulder flexion 5/5 5/5  ?Shoulder extension 5/5 5/5  ?Shoulder abduction 5/5 5/5  ?Shoulder adduction      ?Shoulder extension 5/5 5/5  ?Shoulder internal rotation      ?Shoulder external rotation      ?Middle trapezius 3+/5 3+/5  ?Lower trapezius 3+/5 3+/5  ?Elbow flexion 5/5 5/5  ?Elbow extension 5/5 5/5  ?Wrist flexion 5/5 5/5  ?Wrist extension 5/5 5/5  ?Wrist ulnar deviation      ?Wrist radial deviation      ?Wrist pronation      ?Wrist supination      ?Grip strength Good  Good  ? (Blank rows = not tested) ?  ?CERVICAL SPECIAL TESTS:  ?Cranial cervical flexion test: Negative and Spurling's test: Negative ?  ?  ?PATIENT SURVEYS:  ?FOTO 51/61 ?  ?TODAY'S TREATMENT:  ? ?04/09/21 ? ?MANUAL  ?Right upper trap trigger point release  ?Upper trap stretch  ?Suboccipital release  ? ? ?THEREX  ? ?Education about trigger point release using theracane ?Chin Tucks 3 x 10  ?Scapular Retraction 3 x 10 with GTB  ? ? ? ?04/07/21 ?Upper Trap Stretch 3 x 60 sec  ?  ?  ?PATIENT EDUCATION:  ?Education details: form and technique for appropriate exercise and use of theracane to release trigger points  ?Person educated: Patient ?Education method: Explanation, Demonstration, Verbal cues, and Handouts ?Education comprehension: verbalized understanding and returned demonstration ?  ?  ?HOME EXERCISE PROGRAM: ?Access Code: [email or web address] ?URL: [email or web address] ?Date: 04/09/2021 ?Prepared by: Ellin Goodie ? ?Exercises ?- Seated Gentle Upper Trapezius Stretch  - 1 x daily - 7 x weekly - 1 sets - 3 reps - 60 hold ?- Seated Shoulder Row with Anchored Resistance  - 1 x daily - 3 x weekly - 3 sets - 10 reps ?- Seated Cervical Retraction  - 1 x daily - 7 x  weekly - 3 sets - 10 reps ?  ?ASSESSMENT: ?  ?CLINICAL IMPRESSION: ?Pt exhibits decreased right trap muscular tension upon palpation and decreased pain. She demonstrates improved independence in treatment of condition with ability to independently release trigger points with theracane. She was able to perform all exercises without an increase in her upper trap pain.  ?  ?  ?OBJECTIVE IMPAIRMENTS decreased ROM, decreased strength, and pain.  ?  ?ACTIVITY LIMITATIONS occupation.  ?  ?PERSONAL FACTORS Age, Past/current experiences, and Profession  are also affecting patient's functional outcome.  ?  ?  ?REHAB POTENTIAL: Excellent ?  ?CLINICAL DECISION MAKING: Stable/uncomplicated ?  ?EVALUATION COMPLEXITY: Low ?  ?  ?GOALS: ?Goals reviewed with patient? Yes ?  ?SHORT TERM GOALS: Target date: 04/21/2021 ?  ?Pt will be independent with HEP in order to improve strength and balance in order to decrease fall risk and improve function at home and work. ?Baseline: NT  ?Goal status: INITIAL ?  ?  ?  ?LONG TERM GOALS: Target date: 06/16/2021 ?  ?Patient will have improved function and activity level as evidenced by an increase in FOTO score by 10 points or more to demonstrate improved self-perception of function.  ?Baseline: 51/61 ?Goal status: INITIAL ?  ?2.  Patient will improve parascapular muscle strength to >=4/5 for improved stability of shoulder girdle to relieve neck tension and pain and tolerate increase UE activity.  ?Baseline: Middle Trap R/L 3+/ 3+ , Lower Trap 3+/3+ ?Goal status: INITIAL ?  ?3.  Patient will increase cervical AROM to within normal limits (cervical side bending 45 deg, cervical rot 60, cervical flex 45, cervical ext 45) to indicate resolution of muscle guarding from cervicalgia and increased function of neck to complete job related tasks.  ?Baseline: Cervical Side Bending R/L: 45/25 Cervical Rot R/L: 45/45 Cervical Flex R/L 30, 30 Cervical Ext 30, 30 ?Goal status: INITIAL ?  ?  ?  ?  ?PLAN: ?PT  FREQUENCY: 1-2x/week ?  ?PT DURATION: 10 weeks ?  ?PLANNED INTERVENTIONS: Therapeutic exercises, Therapeutic activity, Patient/Family education, Joint manipulation, Joint mobilization, Aquatic Therapy, Dry Needling, Spin

## 2021-04-21 ENCOUNTER — Encounter: Payer: Self-pay | Admitting: Physical Therapy

## 2021-04-21 ENCOUNTER — Ambulatory Visit: Payer: Medicare Other | Attending: Family Medicine | Admitting: Physical Therapy

## 2021-04-21 DIAGNOSIS — M542 Cervicalgia: Secondary | ICD-10-CM | POA: Diagnosis not present

## 2021-04-21 NOTE — Therapy (Signed)
?OUTPATIENT PHYSICAL THERAPY TREATMENT NOTE ? ? ?Patient Name: Meghan Higgins ?MRN: [id number] ?DOB:Jul 12, 1955, 66 y.o., female ?Today's Date: 04/22/2021 ? ?PCP: Leone Haven, MD ?REFERRING PROVIDER: Leone Haven, MD ? ? PT End of Session - 04/21/21 1100  ? ? Visit Number 4   ? Number of Visits 10   ? Date for PT Re-Evaluation 06/16/21   ? Authorization Type [facility] Medicare 2023   ? Progress Note Due on Visit 10   ? PT Start Time 1150   ? PT Stop Time 1230   ? PT Time Calculation (min) 40 min   ? Activity Tolerance Patient tolerated treatment well   ? Behavior During Therapy [facility] for tasks assessed/performed   ? ?  ?  ? ?  ? ? ?Past Medical History:  ?Diagnosis Date  ? Allergy   ? Arthritis   ? Frequent headaches   ? Thyroid disease   ? ?Past Surgical History:  ?Procedure Laterality Date  ? APPENDECTOMY  1967  ? ?Patient Active Problem List  ? Diagnosis Date Noted  ? Chronic neck pain 03/24/2021  ? Elevated LDL cholesterol level 07/21/2018  ? Abnormal mammogram 02/24/2017  ? Allergic rhinitis 02/24/2017  ? Encounter for general adult medical examination with abnormal findings 01/01/2016  ? Hypothyroidism 12/02/2014  ? ? ?REFERRING DIAG: Cervicalgia  ? ?THERAPY DIAG:  ?Cervicalgia ? ?PERTINENT HISTORY: Dr. Caryl Bis 03/24/21 ?  ?Chronic neck pain/right fingertip numbness: Patient reports chronic issues with right-sided neck discomfort.  She notes a pulling sensation in that area since having neck discomfort and headache a week ago.  She notes some numbness intermittently on the tips of her right third and fourth fingers as well as occasionally her second finger.  Notes the numbness does not occur every day ? ?PRECAUTIONS: None  ? ?SUBJECTIVE: Pt reports improved symptom response since performing upper trap stretch. 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  ? ?PAIN:  ?Are you having pain? No ? ?OBJECTIVE:  ?  ?VITALS: BP 131/80 HR 84 SpO2 100 ?  ?   ?Cervical Red Flags: Denies all of the following red flags  ?  ?Dizziness, Diplopia, Drop Attacks (loss of power or consciousness), Dysphagia, Dysarthria, Nystagmus, Nausea or vomiting, and other neurological symptoms  ?  ?  ?DIAGNOSTIC FINDINGS:  ?CLINICAL DATA:  Chronic neck pain. Right hand second, third and ?fourth digit numbness at fingertips. ?  ?EXAM: ?CERVICAL SPINE - COMPLETE 4+ VIEW ?  ?COMPARISON:  None. ?  ?FINDINGS: ?There is no evidence of cervical spine fracture or prevertebral soft ?tissue swelling. Alignment is normal. There is straightening of ?normal cervical lordosis. There are mild degenerative endplate ?changes throughout the cervical spine from C3 through C7 with disc ?space narrowing and endplate osteophyte formation. ?  ?IMPRESSION: ?1. No acute bony abnormality. ?2. Mild/moderate multilevel degenerative changes. ?  ?  ?Electronically Signed ?  By: Ronney Asters M.D. ?  On: 03/25/2021 16:39 ?  ?PATIENT SURVEYS:  ?FOTO 51/61 ?  ?  ?COGNITION: ?Overall cognitive status: Within functional limits for tasks assessed ?  ?  ?SENSATION: ?WFL ?  ?POSTURE:  ?Upright posture, no forward flexed posture  ?  ?PALPATION: ?Right side of occipital bone is TTP along with span of muscle down to right clavicle and AC joint.         ?  ?CERVICAL ROM:  ?  ?Active ROM A/PROM (deg) ?04/07/2021  ?Flexion 30  ?  Extension 30  ?Right lateral flexion 30  ?Left lateral flexion 25  ?Right rotation 45  ?Left rotation 45  ? (Blank rows = not tested) ?  ?  ?Passive ROM A/PROM (deg) ?04/07/2021  ?Flexion 45  ?Extension 45  ?Right lateral flexion 45  ?Left lateral flexion 45  ?Right rotation 60  ?Left rotation 60  ? (Blank rows = not tested) ?  ?UE ROM: ?  ?Active ROM Right ?04/07/2021 Left ?04/07/2021  ?Shoulder flexion 180 180  ?Shoulder extension 60 60  ?Shoulder abduction 180 180  ?Shoulder adduction      ?Shoulder extension      ?Shoulder internal rotation 70 70  ?Shoulder external rotation 90 90  ?Elbow flexion 150 150   ?Elbow extension 0 0  ?Wrist flexion 80 80  ?Wrist extension 70 70  ?Wrist ulnar deviation 30 30  ?Wrist radial deviation 20 20  ?Wrist pronation 80 80  ?Wrist supination 80 80  ? (Blank rows = not tested) ?  ?  ? ?  ?  ?  ?UE MMT: ?  ?MMT Right ?04/07/2021 Left ?04/07/2021  ?Shoulder flexion 5/5 5/5  ?Shoulder extension 5/5 5/5  ?Shoulder abduction 5/5 5/5  ?Shoulder adduction      ?Shoulder extension 5/5 5/5  ?Shoulder internal rotation      ?Shoulder external rotation      ?Middle trapezius 3+/5 3+/5  ?Lower trapezius 3+/5 3+/5  ?Elbow flexion 5/5 5/5  ?Elbow extension 5/5 5/5  ?Wrist flexion 5/5 5/5  ?Wrist extension 5/5 5/5  ?Wrist ulnar deviation      ?Wrist radial deviation      ?Wrist pronation      ?Wrist supination      ?Grip strength Good  Good  ? (Blank rows = not tested) ?  ?CERVICAL SPECIAL TESTS:  ?Cranial cervical flexion test: Negative and Spurling's test: Negative ?  ?  ?PATIENT SURVEYS:  ?FOTO 51/61 ?  ?TODAY'S TREATMENT:  ? ?04/21/21 ?UBE at seat level 10, 2 min forward and 3 min backward  ?-pain reported in right biceps tendon going backwards  ? ?Hawkin's [place]: + R  ?                     Neer's Test: + R  ? ?Omega Scapular Retraction #25 3 x 10  ?PNF D2 Flexion with yellow TB 1 x 10   ?Wall Y's 1 x 10  ?-Pt reports that exercise is too easy  ?Prone Y's with #2 DB 3 x 10  ? ?04/09/21 ? ?MANUAL  ?Right upper trap trigger point release  ?Upper trap stretch  ?Suboccipital release  ? ? ?[place]  ? ?Education about trigger point release using theracane ?Chin Tucks 3 x 10  ?Scapular Retraction 3 x 10 with GTB  ? ? ? ?04/07/21 ?Upper Trap Stretch 3 x 60 sec  ?  ?  ?PATIENT EDUCATION:  ?Education details: form and technique for appropriate exercise and use of theracane to release trigger points  ?Person educated: Patient ?Education method: Explanation, Demonstration, Verbal cues, and Handouts ?Education comprehension: verbalized understanding and returned demonstration ?  ?  ?HOME EXERCISE PROGRAM: ?Access  Code: [email or web address] ?URL: [email or web address] ?Date: 04/09/2021 ?Prepared by: Bradly Chris ? ?Exercises ?- Seated Gentle Upper Trapezius Stretch  - 1 x daily - 7 x weekly - 1 sets - 3 reps - 60 hold ?- Seated Shoulder Row with Anchored Resistance  - 1 x daily - 3 x weekly - 3 sets - 10 reps ?-  Seated Cervical Retraction  - 1 x daily - 7 x weekly - 3 sets - 10 reps ?  ?ASSESSMENT: ?  ?CLINICAL IMPRESSION: ?Pt continues to exhibit improved shoulder strength with ability to tolerate increased resistance with lower trap exercises and with scapular rows without provocation of her symptoms. 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 Focus of session will continue to be on parascapular and shoulder strengthening to improve standing posture and activity tolerance  ?  ?  ?OBJECTIVE IMPAIRMENTS decreased ROM, decreased strength, and pain.  ?  ?ACTIVITY LIMITATIONS occupation.  ?  ?PERSONAL FACTORS Age, Past/current experiences, and Profession are also affecting patient's functional outcome.  ?  ?  ?REHAB POTENTIAL: Excellent ?  ?CLINICAL DECISION MAKING: Stable/uncomplicated ?  ?EVALUATION COMPLEXITY: Low ?  ?  ?GOALS: ?Goals reviewed with patient? Yes ?  ?SHORT TERM GOALS: Target date: 04/21/2021 ?  ?Pt will be independent with HEP in order to improve strength and balance in order to decrease fall risk and improve function at home and work. ?Baseline: NT  ?Goal status: INITIAL ?  ?  ?  ?LONG TERM GOALS: Target date: 06/16/2021 ?  ?Patient will have improved function and activity level as evidenced by an increase in FOTO score by 10 points or more to demonstrate improved self-perception of function.  ?Baseline: 51/61 ?Goal status: INITIAL ?  ?2.  Patient will improve parascapular muscle strength to >=4/5 for improved stability of shoulder girdle to relieve neck tension and pain and tolerate increase UE activity.  ?Baseline: Middle Trap R/L 3+/ 3+ , Lower Trap  3+/3+ ?Goal status: INITIAL ?  ?3.  Patient will increase cervical AROM to within normal limits (cervical side bending 45 deg, cervical rot 60, cervical flex 45, cervical ext 45) to indicate resolution of muscle

## 2021-04-23 ENCOUNTER — Ambulatory Visit: Payer: Medicare Other | Admitting: Physical Therapy

## 2021-04-23 ENCOUNTER — Encounter: Payer: Self-pay | Admitting: Physical Therapy

## 2021-04-23 DIAGNOSIS — M542 Cervicalgia: Secondary | ICD-10-CM | POA: Diagnosis not present

## 2021-04-23 NOTE — Therapy (Signed)
?OUTPATIENT PHYSICAL THERAPY TREATMENT NOTE ? ? ?Patient Name: Meghan Higgins ?MRN: [id number] ?DOB:[birth year], 66 y.o., female ?Today's Date: 04/23/2021 ? ?PCP: Glori Luis, MD ?REFERRING PROVIDER: Glori Luis, MD ? ? PT End of Session - 04/21/21 1100  ? ? Visit Number 4   ? Number of Visits 10   ? Date for PT Re-Evaluation 06/16/21   ? Authorization Type [facility] Medicare 2023   ? Progress Note Due on Visit 10   ? PT Start Time 1150   ? PT Stop Time 1230   ? PT Time Calculation (min) 40 min   ? Activity Tolerance Patient tolerated treatment well   ? Behavior During Therapy [facility] for tasks assessed/performed   ? ?  ?  ? ?  ? ? ?Past Medical History:  ?Diagnosis Date  ? Allergy   ? Arthritis   ? Frequent headaches   ? Thyroid disease   ? ?Past Surgical History:  ?Procedure Laterality Date  ? APPENDECTOMY  1967  ? ?Patient Active Problem List  ? Diagnosis Date Noted  ? Chronic neck pain 03/24/2021  ? Elevated LDL cholesterol level 07/21/2018  ? Abnormal mammogram 02/24/2017  ? Allergic rhinitis 02/24/2017  ? Encounter for general adult medical examination with abnormal findings 01/01/2016  ? Hypothyroidism 12/02/2014  ? ? ?REFERRING DIAG: Cervicalgia  ? ?THERAPY DIAG:  ?Cervicalgia ? ?PERTINENT HISTORY: Dr. Birdie Sons 03/24/21 ?  ?Chronic neck pain/right fingertip numbness: Patient reports chronic issues with right-sided neck discomfort.  She notes a pulling sensation in that area since having neck discomfort and headache a week ago.  She notes some numbness intermittently on the tips of her right third and fourth fingers as well as occasionally her second finger.  Notes the numbness does not occur every day ? ?PRECAUTIONS: None  ? ?SUBJECTIVE: Pt continues to feel pain free with UE activity even after reporting increased activity with yard work.  ? ?PAIN:  ?Are you having pain? No ? ?OBJECTIVE:  ?  ?VITALS: BP 131/80 HR 84 SpO2 100 ?  ?  ?Cervical Red Flags: Denies all of the following red flags  ?  ?Dizziness,  Diplopia, Drop Attacks (loss of power or consciousness), Dysphagia, Dysarthria, Nystagmus, Nausea or vomiting, and other neurological symptoms  ?  ?  ?DIAGNOSTIC FINDINGS:  ?CLINICAL DATA:  Chronic neck pain. Right hand second, third and ?fourth digit numbness at fingertips. ?  ?EXAM: ?CERVICAL SPINE - COMPLETE 4+ VIEW ?  ?COMPARISON:  None. ?  ?FINDINGS: ?There is no evidence of cervical spine fracture or prevertebral soft ?tissue swelling. Alignment is normal. There is straightening of ?normal cervical lordosis. There are mild degenerative endplate ?changes throughout the cervical spine from C3 through C7 with disc ?space narrowing and endplate osteophyte formation. ?  ?IMPRESSION: ?1. No acute bony abnormality. ?2. Mild/moderate multilevel degenerative changes. ?  ?  ?Electronically Signed ?  By: Darliss Cheney M.D. ?  On: 03/25/2021 16:39 ?  ?PATIENT SURVEYS:  ?FOTO 51/61 ?  ?  ?COGNITION: ?Overall cognitive status: Within functional limits for tasks assessed ?  ?  ?SENSATION: ?WFL ?  ?POSTURE:  ?Upright posture, no forward flexed posture  ?  ?PALPATION: ?Right side of occipital bone is TTP along with span of muscle down to right clavicle and AC joint.         ?  ?CERVICAL ROM:  ?  ?Active ROM A/PROM (deg) ?04/07/2021  ?Flexion 30  ?Extension 30  ?Right lateral flexion 30  ?Left lateral flexion 25  ?  Right rotation 45  ?Left rotation 45  ? (Blank rows = not tested) ?  ?  ?Passive ROM A/PROM (deg) ?04/07/2021  ?Flexion 45  ?Extension 45  ?Right lateral flexion 45  ?Left lateral flexion 45  ?Right rotation 60  ?Left rotation 60  ? (Blank rows = not tested) ?  ?UE ROM: ?  ?Active ROM Right ?04/07/2021 Left ?04/07/2021  ?Shoulder flexion 180 180  ?Shoulder extension 60 60  ?Shoulder abduction 180 180  ?Shoulder adduction      ?Shoulder extension      ?Shoulder internal rotation 70 70  ?Shoulder external rotation 90 90  ?Elbow flexion 150 150  ?Elbow extension 0 0  ?Wrist flexion 80 80  ?Wrist extension 70 70  ?Wrist ulnar  deviation 30 30  ?Wrist radial deviation 20 20  ?Wrist pronation 80 80  ?Wrist supination 80 80  ? (Blank rows = not tested) ?  ?  ? ?  ?  ?  ?UE MMT: ?  ?MMT Right ?04/07/2021 Left ?04/07/2021  ?Shoulder flexion 5/5 5/5  ?Shoulder extension 5/5 5/5  ?Shoulder abduction 5/5 5/5  ?Shoulder adduction      ?Shoulder extension 5/5 5/5  ?Shoulder internal rotation      ?Shoulder external rotation      ?Middle trapezius 3+/5 3+/5  ?Lower trapezius 3+/5 3+/5  ?Elbow flexion 5/5 5/5  ?Elbow extension 5/5 5/5  ?Wrist flexion 5/5 5/5  ?Wrist extension 5/5 5/5  ?Wrist ulnar deviation      ?Wrist radial deviation      ?Wrist pronation      ?Wrist supination      ?Grip strength Good  Good  ? (Blank rows = not tested) ?  ?CERVICAL SPECIAL TESTS:  ?Cranial cervical flexion test: Negative and Spurling's test: Negative ?  ?  ?PATIENT SURVEYS:  ?FOTO 51/61 ?  ?TODAY'S TREATMENT:  ? ?04/23/21 ?UBE Resistance level 3 Seat level 8 - 6 min  ?Quadruped Y's with #5 DB 1 x 10  ?Quadruped T's with #5 DB 1 x 10  ?Shoulder Front Raises  #5 DB 3 x 10  ?Shoulder Abduction #5 DB 3 x 10  ?Omega Face Pulls #10 1 x 10  ?Quadruped W's #3 1 x 10  ?Face Pulls on door knob with GTB 1 x 10   ? ?04/21/21 ?UBE at seat level 10, 2 min forward and 3 min backward  ?-pain reported in right biceps tendon going backwards  ? ?Hawkin's Kennedy: + R  ?                     Neer's Test: + R  ? ?Omega Scapular Retraction #25 3 x 10  ?PNF D2 Flexion with yellow TB 1 x 10   ?Wall Y's 1 x 10  ?-Pt reports that exercise is too easy  ?Prone Y's with #2 DB 3 x 10  ? ?04/09/21 ? ?MANUAL  ?Right upper trap trigger point release  ?Upper trap stretch  ?Suboccipital release  ? ? ?THEREX  ? ?Education about trigger point release using theracane ?Chin Tucks 3 x 10  ?Scapular Retraction 3 x 10 with GTB  ? ? ? ?  ?  ?PATIENT EDUCATION:  ?Education details: form and technique for appropriate exercise and use of theracane to release trigger points  ?Person educated: Patient ?Education  method: Explanation, Demonstration, Verbal cues, and Handouts ?Education comprehension: verbalized understanding and returned demonstration ?  ?  ?HOME EXERCISE PROGRAM: ?Access Code: [email or web address] ?URL: [email or web address] ?  Date: 04/23/2021 ?Prepared by: Ellin Goodieaniel Naleyah Ohlinger ? ?Exercises ?- Seated Cervical Retraction  - 1 x daily - 7 x weekly - 3 sets - 10 reps ?- Seated Gentle Upper Trapezius Stretch  - 1 x daily - 7 x weekly - 1 sets - 3 reps - 60 hold ?- Seated Shoulder Row with Anchored Resistance  - 1 x daily - 3 x weekly - 3 sets - 10 reps ?- Prone Single Arm Shoulder Y  - 1 x daily - 3 x weekly - 3 sets - 10 reps ?- Prone Shoulder Horizontal Abduction  - 1 x daily - 3 x weekly - 3 sets - 10 reps ?- Standing Shoulder Abduction with Dumbbells  - 1 x daily - 3 x weekly - 3 sets - 10 reps ?  ?ASSESSMENT: ?  ?CLINICAL IMPRESSION: ?Pt demonstrates increased shoulder strength with ability to perform weighted shoulder raises and should lateral raises and prone parascapular exercises without pain or discomfort. Pain decrease and muscle tension trending downwards for the past several weeks. If continuing to trend downward next work and pt meets all goals, then pt is likely ready to discharge with HEP.  ?  ?  ?OBJECTIVE IMPAIRMENTS decreased ROM, decreased strength, and pain.  ?  ?ACTIVITY LIMITATIONS occupation.  ?  ?PERSONAL FACTORS Age, Past/current experiences, and Profession are also affecting patient's functional outcome.  ?  ?  ?REHAB POTENTIAL: Excellent ?  ?CLINICAL DECISION MAKING: Stable/uncomplicated ?  ?EVALUATION COMPLEXITY: Low ?  ?  ?GOALS: ?Goals reviewed with patient? Yes ?  ?SHORT TERM GOALS: Target date: 04/21/2021 ?  ?Pt will be independent with HEP in order to improve strength and balance in order to decrease fall risk and improve function at home and work. ?Baseline: NT  ?Goal status: INITIAL ?  ?  ?  ?LONG TERM GOALS: Target date: 06/16/2021 ?  ?Patient will have improved function and activity  level as evidenced by an increase in FOTO score by 10 points or more to demonstrate improved self-perception of function.  ?Baseline: 51/61 ?Goal status: INITIAL ?  ?2.  Patient will improve parascapular

## 2021-04-28 ENCOUNTER — Ambulatory Visit: Payer: Medicare Other | Admitting: Physical Therapy

## 2021-04-28 ENCOUNTER — Encounter: Payer: Self-pay | Admitting: Physical Therapy

## 2021-04-28 DIAGNOSIS — M542 Cervicalgia: Secondary | ICD-10-CM | POA: Diagnosis not present

## 2021-04-28 NOTE — Therapy (Signed)
?OUTPATIENT PHYSICAL THERAPY TREATMENT NOTE ? ? ?Patient Name: Meghan Higgins ?MRN: [id number] ?DOB:1955/12/29, 66 y.o., female ?Today's Date: 04/28/2021 ? ?PCP: Leone Haven, MD ?REFERRING PROVIDER: Leone Haven, MD ? ? PT End of Session - 04/28/21 1114   ? ? Visit Number 5   ? Number of Visits 10   ? Date for PT Re-Evaluation 06/16/21   ? Authorization Type [facility] Medicare 2023   ? Authorization Time Period 04/07/21-06/16/21   ? Progress Note Due on Visit 10   ? PT Start Time 1105   ? PT Stop Time 1145   ? PT Time Calculation (min) 40 min   ? Activity Tolerance Patient tolerated treatment well   ? Behavior During Therapy [facility] for tasks assessed/performed   ? ?  ?  ? ?  ? ? ? ? ? ? ?Past Medical History:  ?Diagnosis Date  ? Allergy   ? Arthritis   ? Frequent headaches   ? Thyroid disease   ? ?Past Surgical History:  ?Procedure Laterality Date  ? APPENDECTOMY  1967  ? ?Patient Active Problem List  ? Diagnosis Date Noted  ? Chronic neck pain 03/24/2021  ? Elevated LDL cholesterol level 07/21/2018  ? Abnormal mammogram 02/24/2017  ? Allergic rhinitis 02/24/2017  ? Encounter for general adult medical examination with abnormal findings 01/01/2016  ? Hypothyroidism 12/02/2014  ? ? ?REFERRING DIAG: Cervicalgia  ? ?THERAPY DIAG:  ?Cervicalgia ? ?PERTINENT HISTORY: Dr. Caryl Bis 03/24/21 ?  ?Chronic neck pain/right fingertip numbness: Patient reports chronic issues with right-sided neck discomfort.  She notes a pulling sensation in that area since having neck discomfort and headache a week ago.  She notes some numbness intermittently on the tips of her right third and fourth fingers as well as occasionally her second finger.  Notes the numbness does not occur every day ? ?PRECAUTIONS: None  ? ?SUBJECTIVE: Continues to not feel pain in shoulder despite ongoing exercises and job related tasks.  ? ?PAIN:  ?Are you having pain? No ? ?OBJECTIVE:  ?  ?VITALS: BP 131/80 HR 84 SpO2 100 ?  ?  ?Cervical Red Flags: Denies all of the  following red flags  ?  ?Dizziness, Diplopia, Drop Attacks (loss of power or consciousness), Dysphagia, Dysarthria, Nystagmus, Nausea or vomiting, and other neurological symptoms  ?  ?  ?DIAGNOSTIC FINDINGS:  ?CLINICAL DATA:  Chronic neck pain. Right hand second, third and ?fourth digit numbness at fingertips. ?  ?EXAM: ?CERVICAL SPINE - COMPLETE 4+ VIEW ?  ?COMPARISON:  None. ?  ?FINDINGS: ?There is no evidence of cervical spine fracture or prevertebral soft ?tissue swelling. Alignment is normal. There is straightening of ?normal cervical lordosis. There are mild degenerative endplate ?changes throughout the cervical spine from C3 through C7 with disc ?space narrowing and endplate osteophyte formation. ?  ?IMPRESSION: ?1. No acute bony abnormality. ?2. Mild/moderate multilevel degenerative changes. ?  ?  ?Electronically Signed ?  By: Ronney Asters M.D. ?  On: 03/25/2021 16:39 ?  ?PATIENT SURVEYS:  ?FOTO 51/61 ?  ?  ?COGNITION: ?Overall cognitive status: Within functional limits for tasks assessed ?  ?  ?SENSATION: ?WFL ?  ?POSTURE:  ?Upright posture, no forward flexed posture  ?  ?PALPATION: ?Right side of occipital bone is TTP along with span of muscle down to right clavicle and AC joint.         ?  ?CERVICAL ROM:  ?  ?Active ROM A/PROM (deg) ?04/07/2021  ?Flexion 30  ?Extension 30  ?Right  lateral flexion 30  ?Left lateral flexion 25  ?Right rotation 45  ?Left rotation 45  ? (Blank rows = not tested) ?  ?  ?Passive ROM A/PROM (deg) ?04/07/2021  ?Flexion 45  ?Extension 45  ?Right lateral flexion 45  ?Left lateral flexion 45  ?Right rotation 60  ?Left rotation 60  ? (Blank rows = not tested) ?  ?UE ROM: ?  ?Active ROM Right ?04/07/2021 Left ?04/07/2021  ?Shoulder flexion 180 180  ?Shoulder extension 60 60  ?Shoulder abduction 180 180  ?Shoulder adduction      ?Shoulder extension      ?Shoulder internal rotation 70 70  ?Shoulder external rotation 90 90  ?Elbow flexion 150 150  ?Elbow extension 0 0  ?Wrist flexion 80 80   ?Wrist extension 70 70  ?Wrist ulnar deviation 30 30  ?Wrist radial deviation 20 20  ?Wrist pronation 80 80  ?Wrist supination 80 80  ? (Blank rows = not tested) ?  ?  ? ?  ?  ?  ?UE MMT: ?  ?MMT Right ?04/07/2021 Left ?04/07/2021  ?Shoulder flexion 5/5 5/5  ?Shoulder extension 5/5 5/5  ?Shoulder abduction 5/5 5/5  ?Shoulder adduction      ?Shoulder extension 5/5 5/5  ?Shoulder internal rotation      ?Shoulder external rotation      ?Middle trapezius 3+/5 3+/5  ?Lower trapezius 3+/5 3+/5  ?Elbow flexion 5/5 5/5  ?Elbow extension 5/5 5/5  ?Wrist flexion 5/5 5/5  ?Wrist extension 5/5 5/5  ?Wrist ulnar deviation      ?Wrist radial deviation      ?Wrist pronation      ?Wrist supination      ?Grip strength Good  Good  ? (Blank rows = not tested) ?  ?CERVICAL SPECIAL TESTS:  ?Cranial cervical flexion test: Negative and Spurling's test: Negative ?  ?  ?PATIENT SURVEYS:  ?FOTO 51/61 ?  ?TODAY'S TREATMENT:  ? ?04/28/21 ?UBE Resistance 4 for 6 minutes at seat level 6  ?OMEGA Chest Press #25 3 x 10  ?OMEGA Lat Pull Down #25 3 x 10  ?                      Skull Crushers 1 x 10 #15 DB  ?                      Skull Crushers 2 x 10 #10 DB  ? ? ? ?04/23/21 ?UBE Resistance level 3 Seat level 8 - 6 min  ?Quadruped Y's with #5 DB 1 x 10  ?Quadruped T's with #5 DB 1 x 10  ?Shoulder Front Raises  #5 DB 3 x 10  ?Shoulder Abduction #5 DB 3 x 10  ?Omega Face Pulls #10 1 x 10  ?Quadruped W's #3 1 x 10  ?Face Pulls on door knob with GTB 1 x 10   ? ?04/21/21 ?UBE at seat level 10, 2 min forward and 3 min backward  ?-pain reported in right biceps tendon going backwards  ? ?Hawkin's Kennedy: + R  ?                     Neer's Test: + R  ? ?Omega Scapular Retraction #25 3 x 10  ?PNF D2 Flexion with yellow TB 1 x 10   ?Wall Y's 1 x 10  ?-Pt reports that exercise is too easy  ?Prone Y's with #2 DB 3 x 10  ? ? ? ?  ?  ?  PATIENT EDUCATION:  ?Education details: form and technique for appropriate exercise and use of theracane to release trigger points   ?Person educated: Patient ?Education method: Explanation, Demonstration, Verbal cues, and Handouts ?Education comprehension: verbalized understanding and returned demonstration ?  ?  ?HOME EXERCISE PROGRAM: ?Access Code: [email or web address] ?URL: [email or web address] ?Date: 04/23/2021 ?Prepared by: Bradly Chris ? ?Exercises ?- Seated Cervical Retraction  - 1 x daily - 7 x weekly - 3 sets - 10 reps ?- Seated Gentle Upper Trapezius Stretch  - 1 x daily - 7 x weekly - 1 sets - 3 reps - 60 hold ?- Seated Shoulder Row with Anchored Resistance  - 1 x daily - 3 x weekly - 3 sets - 10 reps ?- Prone Single Arm Shoulder Y  - 1 x daily - 3 x weekly - 3 sets - 10 reps ?- Prone Shoulder Horizontal Abduction  - 1 x daily - 3 x weekly - 3 sets - 10 reps ?- Standing Shoulder Abduction with Dumbbells  - 1 x daily - 3 x weekly - 3 sets - 10 reps ?  ?ASSESSMENT: ?  ?CLINICAL IMPRESSION: ?Pt continues to exhibit pain free UE activity tolerance with overhead activity. Next session will focus on reassessing goals to determine pt's progress towards POC. She continues to show excellent follow through and motivation, and she is likely close to discharging.  ? ?  ?OBJECTIVE IMPAIRMENTS decreased ROM, decreased strength, and pain.  ?  ?ACTIVITY LIMITATIONS occupation.  ?  ?PERSONAL FACTORS Age, Past/current experiences, and Profession are also affecting patient's functional outcome.  ?  ?  ?REHAB POTENTIAL: Excellent ?  ?CLINICAL DECISION MAKING: Stable/uncomplicated ?  ?EVALUATION COMPLEXITY: Low ?  ?  ?GOALS: ?Goals reviewed with patient? Yes ?  ?SHORT TERM GOALS: Target date: 04/21/2021 ?  ?Pt will be independent with HEP in order to improve strength and balance in order to decrease fall risk and improve function at home and work. ?Baseline: NT  ?Goal status: INITIAL ?  ?  ?  ?LONG TERM GOALS: Target date: 06/16/2021 ?  ?Patient will have improved function and activity level as evidenced by an increase in FOTO score by 10 points or  more to demonstrate improved self-perception of function.  ?Baseline: 51/61 ?Goal status: INITIAL ?  ?2.  Patient will improve parascapular muscle strength to >=4/5 for improved stability of shoulder girdle to

## 2021-04-30 ENCOUNTER — Ambulatory Visit: Payer: Medicare Other | Admitting: Physical Therapy

## 2021-04-30 ENCOUNTER — Telehealth: Payer: Self-pay | Admitting: Physical Therapy

## 2021-04-30 NOTE — Telephone Encounter (Signed)
Pt reports that she had requested to switch apt to 1:30 pm and it was said it would be switched but the apt had not been. Instructed pt that this was oversight and to continue with HEP and that we would just continue with next apt being next week.  ?

## 2021-05-05 ENCOUNTER — Encounter: Payer: Self-pay | Admitting: Physical Therapy

## 2021-05-05 ENCOUNTER — Ambulatory Visit: Payer: Medicare Other | Admitting: Physical Therapy

## 2021-05-05 DIAGNOSIS — M542 Cervicalgia: Secondary | ICD-10-CM | POA: Diagnosis not present

## 2021-05-05 NOTE — Therapy (Signed)
?OUTPATIENT PHYSICAL THERAPY TREATMENT NOTE ? ? ?Patient Name: Meghan Higgins ?MRN: [id number] ?DOB:[birth year], 66 y.o., female ?Today's Date: 05/05/2021 ? ?PCP: Glori Luis, MD ?REFERRING PROVIDER: Glori Luis, MD ? ? PT End of Session - 05/05/21 1335   ? ? Visit Number 6   ? Number of Visits 10   ? Date for PT Re-Evaluation 06/16/21   ? Authorization Type [facility] Medicare 2023   ? Authorization Time Period 04/07/21-06/16/21   ? Progress Note Due on Visit 10   ? PT Start Time 1330   ? PT Stop Time 1415   ? PT Time Calculation (min) 45 min   ? Activity Tolerance Patient tolerated treatment well   ? Behavior During Therapy [facility] for tasks assessed/performed   ? ?  ?  ? ?  ? ? ? ? ? ? ?Past Medical History:  ?Diagnosis Date  ? Allergy   ? Arthritis   ? Frequent headaches   ? Thyroid disease   ? ?Past Surgical History:  ?Procedure Laterality Date  ? APPENDECTOMY  1967  ? ?Patient Active Problem List  ? Diagnosis Date Noted  ? Chronic neck pain 03/24/2021  ? Elevated LDL cholesterol level 07/21/2018  ? Abnormal mammogram 02/24/2017  ? Allergic rhinitis 02/24/2017  ? Encounter for general adult medical examination with abnormal findings 01/01/2016  ? Hypothyroidism 12/02/2014  ? ? ?REFERRING DIAG: Cervicalgia  ? ?THERAPY DIAG:  ?Cervicalgia ? ?PERTINENT HISTORY: Dr. Birdie Sons 03/24/21 ?  ?Chronic neck pain/right fingertip numbness: Patient reports chronic issues with right-sided neck discomfort.  She notes a pulling sensation in that area since having neck discomfort and headache a week ago.  She notes some numbness intermittently on the tips of her right third and fourth fingers as well as occasionally her second finger.  Notes the numbness does not occur every day ? ?PRECAUTIONS: None  ? ?SUBJECTIVE: She continues to remain pain free with job and personal UE tasks. She will be going to [employer] to trial using machines with her mother.  ? ?PAIN:  ?Are you having pain? No ? ?OBJECTIVE:  ?  ?VITALS: BP 131/80 HR 84  SpO2 100 ?  ?  ?Cervical Red Flags: Denies all of the following red flags  ?  ?Dizziness, Diplopia, Drop Attacks (loss of power or consciousness), Dysphagia, Dysarthria, Nystagmus, Nausea or vomiting, and other neurological symptoms  ?  ?  ?DIAGNOSTIC FINDINGS:  ?CLINICAL DATA:  Chronic neck pain. Right hand second, third and ?fourth digit numbness at fingertips. ?  ?EXAM: ?CERVICAL SPINE - COMPLETE 4+ VIEW ?  ?COMPARISON:  None. ?  ?FINDINGS: ?There is no evidence of cervical spine fracture or prevertebral soft ?tissue swelling. Alignment is normal. There is straightening of ?normal cervical lordosis. There are mild degenerative endplate ?changes throughout the cervical spine from C3 through C7 with disc ?space narrowing and endplate osteophyte formation. ?  ?IMPRESSION: ?1. No acute bony abnormality. ?2. Mild/moderate multilevel degenerative changes. ?  ?  ?Electronically Signed ?  By: Darliss Cheney M.D. ?  On: 03/25/2021 16:39 ?  ?PATIENT SURVEYS:  ?FOTO 51/61 ?  ?  ?COGNITION: ?Overall cognitive status: Within functional limits for tasks assessed ?  ?  ?SENSATION: ?WFL ?  ?POSTURE:  ?Upright posture, no forward flexed posture  ?  ?PALPATION: ?Right side of occipital bone is TTP along with span of muscle down to right clavicle and AC joint.         ?  ?CERVICAL ROM:  ?  ?Active  ROM A/PROM (deg) ?04/07/2021  ?Flexion 30  ?Extension 30  ?Right lateral flexion 30  ?Left lateral flexion 25  ?Right rotation 45  ?Left rotation 45  ? (Blank rows = not tested) ?  ?  ?Passive ROM A/PROM (deg) ?04/07/2021  ?Flexion 45  ?Extension 45  ?Right lateral flexion 45  ?Left lateral flexion 45  ?Right rotation 60  ?Left rotation 60  ? (Blank rows = not tested) ?  ?UE ROM: ?  ?Active ROM Right ?04/07/2021 Left ?04/07/2021  ?Shoulder flexion 180 180  ?Shoulder extension 60 60  ?Shoulder abduction 180 180  ?Shoulder adduction      ?Shoulder extension      ?Shoulder internal rotation 70 70  ?Shoulder external rotation 90 90  ?Elbow flexion  150 150  ?Elbow extension 0 0  ?Wrist flexion 80 80  ?Wrist extension 70 70  ?Wrist ulnar deviation 30 30  ?Wrist radial deviation 20 20  ?Wrist pronation 80 80  ?Wrist supination 80 80  ? (Blank rows = not tested) ?  ?  ? ?  ?  ?  ?UE MMT: ?  ?MMT Right ?04/07/2021 Left ?04/07/2021  ?Shoulder flexion 5/5 5/5  ?Shoulder extension 5/5 5/5  ?Shoulder abduction 5/5 5/5  ?Shoulder adduction      ?Shoulder extension 5/5 5/5  ?Shoulder internal rotation      ?Shoulder external rotation      ?Middle trapezius 3+/5 3+/5  ?Lower trapezius 3+/5 3+/5  ?Elbow flexion 5/5 5/5  ?Elbow extension 5/5 5/5  ?Wrist flexion 5/5 5/5  ?Wrist extension 5/5 5/5  ?Wrist ulnar deviation      ?Wrist radial deviation      ?Wrist pronation      ?Wrist supination      ?Grip strength Good  Good  ? (Blank rows = not tested) ?  ?CERVICAL SPECIAL TESTS:  ?Cranial cervical flexion test: Negative and Spurling's test: Negative ?  ?  ?PATIENT SURVEYS:  ?FOTO 51/61 ?  ?TODAY'S TREATMENT:  ? ?05/05/21 ?OMEGA Seated Rows #25 3 x 10  ?OMEGA Seated Rows #10 3 x 10  ?Front Raises with #5 DB 1 x 10  ?Front Raises with GTB 2 x 10  ?Push Up with Plus 3 x 10  ?-min VC to maintain Mannina straight ahead ? ? ?04/28/21 ?UBE Resistance 4 for 6 minutes at seat level 6  ?OMEGA Chest Press #25 3 x 10  ?OMEGA Lat Pull Down #25 3 x 10  ?                      Skull Crushers 1 x 10 #15 DB  ?                      Skull Crushers 2 x 10 #10 DB  ? ? ?04/23/21 ?UBE Resistance level 3 Seat level 8 - 6 min  ?Quadruped Y's with #5 DB 1 x 10  ?Quadruped T's with #5 DB 1 x 10  ?Shoulder Front Raises  #5 DB 3 x 10  ?Shoulder Abduction #5 DB 3 x 10  ?Omega Face Pulls #10 1 x 10  ?Quadruped W's #3 1 x 10  ?Face Pulls on door knob with GTB 1 x 10   ? ? ? ? ?  ?  ?PATIENT EDUCATION:  ?Education details: form and technique for appropriate exercise and use of theracane to release trigger points  ?Person educated: Patient ?Education method: Explanation, Demonstration, Verbal cues, and  Handouts ?Education comprehension: verbalized understanding and returned demonstration ?  ?  ?HOME EXERCISE PROGRAM: ?Access Code: [email or web address] ?URL: [email or web address] ?Date: 04/23/2021 ?Prepared by: Ellin Goodieaniel Rosette Bellavance ? ?Exercises ?- Seated Cervical Retraction  - 1 x daily - 7 x weekly - 3 sets - 10 reps ?- Seated Gentle Upper Trapezius Stretch  - 1 x daily - 7 x weekly - 1 sets - 3 reps - 60 hold ?- Seated Shoulder Row with Anchored Resistance  - 1 x daily - 3 x weekly - 3 sets - 10 reps ?- Prone Single Arm Shoulder Y  - 1 x daily - 3 x weekly - 3 sets - 10 reps ?- Prone Shoulder Horizontal Abduction  - 1 x daily - 3 x weekly - 3 sets - 10 reps ?- Standing Shoulder Abduction with Dumbbells  - 1 x daily - 3 x weekly - 3 sets - 10 reps ?  ?ASSESSMENT: ?  ?CLINICAL IMPRESSION: ?Pt continues to exhibit pain free UE activity tolerance with parascapular and shoulder exercises with exception of shoulder front raises which appears to be muscular. Weight decreased for front raise so patient could better tolerate exercise. Next session will focus on reassessing goals to determine pt's progress towards POC. She continues to show excellent follow through and motivation, and she is likely close to discharging.  ? ?  ?OBJECTIVE IMPAIRMENTS decreased ROM, decreased strength, and pain.  ?  ?ACTIVITY LIMITATIONS occupation.  ?  ?PERSONAL FACTORS Age, Past/current experiences, and Profession are also affecting patient's functional outcome.  ?  ?  ?REHAB POTENTIAL: Excellent ?  ?CLINICAL DECISION MAKING: Stable/uncomplicated ?  ?EVALUATION COMPLEXITY: Low ?  ?  ?GOALS: ?Goals reviewed with patient? Yes ?  ?SHORT TERM GOALS: Target date: 04/21/2021 ?  ?Pt will be independent with HEP in order to improve strength and balance in order to decrease fall risk and improve function at home and work. ?Baseline: NT  ?Goal status: INITIAL ?  ?  ?  ?LONG TERM GOALS: Target date: 06/16/2021 ?  ?Patient will have improved function and  activity level as evidenced by an increase in FOTO score by 10 points or more to demonstrate improved self-perception of function.  ?Baseline: 51/61 ?Goal status: INITIAL ?  ?2.  Patient will improve parascapular mus

## 2021-05-07 ENCOUNTER — Ambulatory Visit: Payer: Medicare Other | Admitting: Physical Therapy

## 2021-05-07 ENCOUNTER — Encounter: Payer: Self-pay | Admitting: Physical Therapy

## 2021-05-07 DIAGNOSIS — M542 Cervicalgia: Secondary | ICD-10-CM | POA: Diagnosis not present

## 2021-05-07 NOTE — Therapy (Addendum)
?OUTPATIENT PHYSICAL THERAPY TREATMENT NOTE/ Discharge Summary ? ? ?Patient Name: Meghan Higgins ?MRN: [id number] ?DOB:06/16/1955, 66 y.o., female ?Today's Date: 05/07/2021 ? ?PCP: Leone Haven, MD ?REFERRING PROVIDER: Leone Haven, MD ? ? PT End of Session - 05/07/21 1153   ? ? Visit Number 7   ? Number of Visits 10   ? Date for PT Re-Evaluation 06/16/21   ? Authorization Type [facility] Medicare 2023   ? Authorization Time Period 04/07/21-06/16/21   ? Progress Note Due on Visit 10   ? PT Start Time 1150   ? PT Stop Time 1220   ? PT Time Calculation (min) 30 min   ? Activity Tolerance Patient tolerated treatment well   ? Behavior During Therapy [facility] for tasks assessed/performed   ? ?  ?  ? ?  ? ? ? ? ? ? ?Past Medical History:  ?Diagnosis Date  ? Allergy   ? Arthritis   ? Frequent headaches   ? Thyroid disease   ? ?Past Surgical History:  ?Procedure Laterality Date  ? APPENDECTOMY  1967  ? ?Patient Active Problem List  ? Diagnosis Date Noted  ? Chronic neck pain 03/24/2021  ? Elevated LDL cholesterol level 07/21/2018  ? Abnormal mammogram 02/24/2017  ? Allergic rhinitis 02/24/2017  ? Encounter for general adult medical examination with abnormal findings 01/01/2016  ? Hypothyroidism 12/02/2014  ? ? ?REFERRING DIAG: Cervicalgia  ? ?THERAPY DIAG:  ?Cervicalgia ? ?PERTINENT HISTORY: Dr. Caryl Bis 03/24/21 ?  ?Chronic neck pain/right fingertip numbness: Patient reports chronic issues with right-sided neck discomfort.  She notes a pulling sensation in that area since having neck discomfort and headache a week ago.  She notes some numbness intermittently on the tips of her right third and fourth fingers as well as occasionally her second finger.  Notes the numbness does not occur every day ? ?PRECAUTIONS: None  ? ?SUBJECTIVE: Pt has remained pain free for several weeks for now and she is ready for discharge.  ? ?PAIN:  ?Are you having pain? No ? ?OBJECTIVE:  ?  ?VITALS: BP 131/80 HR 84 SpO2 100 ?  ?  ?Cervical Red Flags:  Denies all of the following red flags  ?  ?Dizziness, Diplopia, Drop Attacks (loss of power or consciousness), Dysphagia, Dysarthria, Nystagmus, Nausea or vomiting, and other neurological symptoms  ?  ?  ?DIAGNOSTIC FINDINGS:  ?CLINICAL DATA:  Chronic neck pain. Right hand second, third and ?fourth digit numbness at fingertips. ?  ?EXAM: ?CERVICAL SPINE - COMPLETE 4+ VIEW ?  ?COMPARISON:  None. ?  ?FINDINGS: ?There is no evidence of cervical spine fracture or prevertebral soft ?tissue swelling. Alignment is normal. There is straightening of ?normal cervical lordosis. There are mild degenerative endplate ?changes throughout the cervical spine from C3 through C7 with disc ?space narrowing and endplate osteophyte formation. ?  ?IMPRESSION: ?1. No acute bony abnormality. ?2. Mild/moderate multilevel degenerative changes. ?  ?  ?Electronically Signed ?  By: Ronney Asters M.D. ?  On: 03/25/2021 16:39 ?  ?PATIENT SURVEYS:  ?FOTO 51/61 ?  ?  ?COGNITION: ?Overall cognitive status: Within functional limits for tasks assessed ?  ?  ?SENSATION: ?WFL ?  ?POSTURE:  ?Upright posture, no forward flexed posture  ?  ?PALPATION: ?Right side of occipital bone is TTP along with span of muscle down to right clavicle and AC joint.         ?  ?CERVICAL ROM:  ?  ?Active ROM A/PROM (deg) ?04/07/2021 AROM   ?Flexion  30 45  ?Extension 30 45  ?Right lateral flexion 30 45  ?Left lateral flexion 25 45  ?Right rotation 45 60  ?Left rotation 45 60  ? (Blank rows = not tested) ?  ?  ?Passive ROM A/PROM (deg) ?04/07/2021  ?Flexion 45  ?Extension 45  ?Right lateral flexion 45  ?Left lateral flexion 45  ?Right rotation 60  ?Left rotation 60  ? (Blank rows = not tested) ?  ?UE ROM: ?  ?Active ROM Right ?04/07/2021 Left ?04/07/2021  ?Shoulder flexion 180 180  ?Shoulder extension 60 60  ?Shoulder abduction 180 180  ?Shoulder adduction      ?Shoulder extension      ?Shoulder internal rotation 70 70  ?Shoulder external rotation 90 90  ?Elbow flexion 150 150   ?Elbow extension 0 0  ?Wrist flexion 80 80  ?Wrist extension 70 70  ?Wrist ulnar deviation 30 30  ?Wrist radial deviation 20 20  ?Wrist pronation 80 80  ?Wrist supination 80 80  ? (Blank rows = not tested) ?  ?  ? ?  ?  ?  ?UE MMT: ?  ?MMT Right ?04/07/2021 Left ?04/07/2021  ?Shoulder flexion 5/5 5/5  ?Shoulder extension 5/5 5/5  ?Shoulder abduction 5/5 5/5  ?Shoulder adduction      ?Shoulder extension 5/5 5/5  ?Shoulder internal rotation      ?Shoulder external rotation      ?Middle trapezius 3+/5 3+/5  ?Lower trapezius 3+/5 3+/5  ?Elbow flexion 5/5 5/5  ?Elbow extension 5/5 5/5  ?Wrist flexion 5/5 5/5  ?Wrist extension 5/5 5/5  ?Wrist ulnar deviation      ?Wrist radial deviation      ?Wrist pronation      ?Wrist supination      ?Grip strength Good  Good  ? (Blank rows = not tested) ?  ?CERVICAL SPECIAL TESTS:  ?Cranial cervical flexion test: Negative and Spurling's test: Negative ?  ?  ?PATIENT SURVEYS:  ?FOTO 51/61 ?  ?TODAY'S TREATMENT:  ? ?05/07/21 ?UBE Level 3 Seat 9 for 5 minutes  ? ?          Cervical AROM  ?          -Flexion 45  ?          -Extension 45  ?          -Left Side Bend 45  ?         - Right Side Bend 45                 ?        -  Left Rotation 60  ?         - Right Rotation 60  ?      ? ?Discussion about HEP and progression of exercises.  ? ? ?05/05/21 ?OMEGA Seated Rows #25 3 x 10  ?OMEGA Seated Rows #10 3 x 10  ?Front Raises with #5 DB 1 x 10  ?Front Raises with GTB 2 x 10  ?Push Up with Plus 3 x 10  ?-min VC to maintain Bacci straight ahead ? ? ?04/28/21 ?UBE Resistance 4 for 6 minutes at seat level 6  ?OMEGA Chest Press #25 3 x 10  ?OMEGA Lat Pull Down #25 3 x 10  ?                      Skull Crushers 1 x 10 #15 DB  ?  Skull Crushers 2 x 10 #10 DB  ? ? ? ? ?  ?  ?PATIENT EDUCATION:  ?Education details: form and technique for appropriate exercise and use of theracane to release trigger points  ?Person educated: Patient ?Education method: Explanation, Demonstration, Verbal  cues, and Handouts ?Education comprehension: verbalized understanding and returned demonstration ?  ?  ?HOME EXERCISE PROGRAM: ?Access Code: [email or web address] ?URL: [email or web address] ?Date: 04/23/2021 ?Prepared by: Bradly Chris ? ?Exercises ?- Seated Cervical Retraction  - 1 x daily - 7 x weekly - 3 sets - 10 reps ?- Seated Gentle Upper Trapezius Stretch  - 1 x daily - 7 x weekly - 1 sets - 3 reps - 60 hold ?- Seated Shoulder Row with Anchored Resistance  - 1 x daily - 3 x weekly - 3 sets - 10 reps ?- Prone Single Arm Shoulder Y  - 1 x daily - 3 x weekly - 3 sets - 10 reps ?- Prone Shoulder Horizontal Abduction  - 1 x daily - 3 x weekly - 3 sets - 10 reps ?- Standing Shoulder Abduction with Dumbbells  - 1 x daily - 3 x weekly - 3 sets - 10 reps ?  ?ASSESSMENT: ?  ?CLINICAL IMPRESSION: ?Pt has met all her PT goals with increased cervical ROM and parascapular strength, and she has maximized her PT potential. She was provided with a robust HEP and she will continue to maintain strength and cervical ROM gains.  ? ?  ?OBJECTIVE IMPAIRMENTS decreased ROM, decreased strength, and pain.  ?  ?ACTIVITY LIMITATIONS occupation.  ?  ?PERSONAL FACTORS Age, Past/current experiences, and Profession are also affecting patient's functional outcome.  ?  ?  ?REHAB POTENTIAL: Excellent ?  ?CLINICAL DECISION MAKING: Stable/uncomplicated ?  ?EVALUATION COMPLEXITY: Low ?  ?  ?GOALS: ?Goals reviewed with patient? Yes ?  ?SHORT TERM GOALS: Target date: 04/21/2021 ?  ?Pt will be independent with HEP in order to improve strength and balance in order to decrease fall risk and improve function at home and work. ?Baseline: NT 05/07/21: Can independently perform HEP  ?Goal status: ACHIEVED  ?  ?  ?  ?LONG TERM GOALS: Target date: 06/16/2021 ?  ?Patient will have improved function and activity level as evidenced by an increase in FOTO score by 10 points or more to demonstrate improved self-perception of function.  ?Baseline: 51/61 05/07/21:  96/61  ?Goal status: ACHIEVED  ?  ?2.  Patient will improve parascapular muscle strength to >=4/5 for improved stability of shoulder girdle to relieve neck tension and pain and tolerate increase UE activity.  ?

## 2021-05-12 ENCOUNTER — Ambulatory Visit: Payer: Medicare Other | Admitting: Family Medicine

## 2021-05-12 ENCOUNTER — Encounter: Payer: Self-pay | Admitting: Family Medicine

## 2021-05-12 ENCOUNTER — Encounter: Payer: Medicare Other | Admitting: Physical Therapy

## 2021-05-12 DIAGNOSIS — G8929 Other chronic pain: Secondary | ICD-10-CM | POA: Diagnosis not present

## 2021-05-12 DIAGNOSIS — M542 Cervicalgia: Secondary | ICD-10-CM | POA: Diagnosis not present

## 2021-05-12 DIAGNOSIS — J309 Allergic rhinitis, unspecified: Secondary | ICD-10-CM | POA: Diagnosis not present

## 2021-05-12 NOTE — Assessment & Plan Note (Signed)
Significant improvement.  She will continue her physical therapy exercises at home. ?

## 2021-05-12 NOTE — Assessment & Plan Note (Signed)
Decent control with Allegra and Flonase.  She will continue both of those medications. ?

## 2021-05-12 NOTE — Progress Notes (Signed)
?Marikay Alar, MD ?Phone: [phone] ? ?Meghan Higgins is a 66 y.o. female who presents today for follow-up. ? ?Chronic neck pain: Patient reports physical therapy went very well.  She has no neck pain at this time.  The numbness in her fingertips resolved.  She notes no weakness. ? ?Allergies: Patient notes chronic intermittent issues with postnasal drip and headaches particularly when the weather changes.  She notes no rhinorrhea.  She has occasional sneezing.  No congestion.  She was taking Xyzal though notes it made it difficult for her to sleep.  She is currently on Allegra and Flonase and notes both of those things are beneficial for the postnasal drip.  Notes the headaches occur with changes in barometric pressure. ? ?Social History  ? ?Tobacco Use  ?Smoking Status Former  ? Types: Cigarettes  ? Quit date: 12/01/2013  ? Years since quitting: 7.4  ?Smokeless Tobacco Never  ? ? ?Current Outpatient Medications on File Prior to Visit  ?Medication Sig Dispense Refill  ? Biotin 300 MCG TABS Take by mouth.    ? fexofenadine (ALLEGRA) 180 MG tablet Take 180 mg by mouth daily.    ? fluticasone (FLONASE) 50 MCG/ACT nasal spray Place into both nostrils daily.    ? levothyroxine (SYNTHROID) 88 MCG tablet TAKE ONE TABLET BY MOUTH EVERY MORNING BEFORE BREAKFAST 90 tablet 0  ? Multiple Vitamin (MULTIVITAMIN) tablet Take 1 tablet by mouth daily.    ? ?No current facility-administered medications on file prior to visit.  ? ? ? ?ROS see history of present illness ? ?Objective ? ?Physical Exam ?Vitals:  ? 05/12/21 0825  ?BP: 120/80  ?Pulse: 90  ?Temp: 97.7 ?F (36.5 ?C)  ?SpO2: 98%  ? ? ?BP Readings from Last 3 Encounters:  ?05/12/21 120/80  ?03/24/21 120/80  ?02/12/20 120/80  ? ?Wt Readings from Last 3 Encounters:  ?05/12/21 139 lb 12.8 oz (63.4 kg)  ?03/24/21 136 lb 9.6 oz (62 kg)  ?02/12/20 134 lb 12.8 oz (61.1 kg)  ? ? ?Physical Exam ?Constitutional:   ?   General: She is not in acute distress. ?   Appearance: She is not  diaphoretic.  ?Cardiovascular:  ?   Rate and Rhythm: Normal rate and regular rhythm.  ?   Heart sounds: Normal heart sounds.  ?Pulmonary:  ?   Effort: Pulmonary effort is normal.  ?   Breath sounds: Normal breath sounds.  ?Musculoskeletal:  ?   Comments: No midline neck tenderness, no midline neck step-off, no muscular neck tenderness  ?Skin: ?   General: Skin is warm and dry.  ?Neurological:  ?   Mental Status: She is alert.  ?   Comments: 5/5 strength in bilateral biceps, triceps, grip, quads, hamstrings, plantar and dorsiflexion, sensation to light touch intact in bilateral UE and LE  ? ? ? ?Assessment/Plan: Please see individual problem list. ? ?Problem List Items Addressed This Visit   ? ? Allergic rhinitis  ?  Decent control with Allegra and Flonase.  She will continue both of those medications. ? ?  ?  ? Chronic neck pain  ?  Significant improvement.  She will continue her physical therapy exercises at home. ? ?  ?  ? ? ?Return in about 10 months (around 03/26/2022) for CPE. ? ?This visit occurred during the SARS-CoV-2 public health emergency.  Safety protocols were in place, including screening questions prior to the visit, additional usage of staff PPE, and extensive cleaning of exam room while observing appropriate contact time as  indicated for disinfecting solutions.  ? ? ?Marikay Alar, MD ?[facility] [place] ? ?

## 2021-05-12 NOTE — Patient Instructions (Signed)
Nice to see you. ?Please continue to your physical therapy exercises at home. ?If your allergy symptoms worsen at any point please let me know. ?

## 2021-05-13 ENCOUNTER — Ambulatory Visit (INDEPENDENT_AMBULATORY_CARE_PROVIDER_SITE_OTHER): Payer: Medicare Other

## 2021-05-13 VITALS — Ht 65.0 in | Wt 139.0 lb

## 2021-05-13 DIAGNOSIS — Z0000 Encounter for general adult medical examination without abnormal findings: Secondary | ICD-10-CM | POA: Diagnosis not present

## 2021-05-13 NOTE — Patient Instructions (Addendum)
?  Meghan Higgins , ?Thank you for taking time to come for your Medicare Wellness Visit. I appreciate your ongoing commitment to your health goals. Please review the following plan we discussed and let me know if I can assist you in the future.  ? ?These are the goals we discussed: ? Goals   ? ?  Maintain Healthy Lifestyle   ?  Stay active ?Healthy diet ?  ? ?  ?  ?This is a list of the screening recommended for you and due dates:  ?Health Maintenance  ?Topic Date Due  ? Mammogram  08/08/2019  ? DEXA scan (bone density measurement)  Never done  ? Pneumonia Vaccine (1 - PCV) 05/14/2022*  ? Tetanus Vaccine  05/14/2022*  ? Flu Shot  08/11/2021  ? Cologuard (Stool DNA test)  03/21/2023  ? COVID-19 Vaccine  Completed  ? Hepatitis C Screening: USPSTF Recommendation to screen - Ages 53-79 yo.  Completed  ? Zoster (Shingles) Vaccine  Completed  ? HPV Vaccine  Aged Out  ?*Topic was postponed. The date shown is not the original due date.  ?  ?

## 2021-05-13 NOTE — Progress Notes (Signed)
Subjective:   Meghan Higgins is a 66 y.o. female who presents for an Initial Medicare Annual Wellness Visit.  Review of Systems    No ROS.  Medicare Wellness Virtual Visit.  Visual/audio telehealth visit, UTA vital signs.   See social history for additional risk factors.   Cardiac Risk Factors include: advanced age (>21men, >9 women)     Objective:    Today's Vitals   05/13/21 1250  Weight: 139 lb (63 kg)  Height: 5\' 5"  (1.651 m)   Body mass index is 23.13 kg/m.     05/13/2021   12:53 PM 04/07/2021    8:16 PM  Advanced Directives  Does Patient Have a Medical Advance Directive? No Yes  Type of Advance Directive  Living will  Does patient want to make changes to medical advance directive?  No - Patient declined  Would patient like information on creating a medical advance directive? No - Patient declined No - Patient declined    Current Medications (verified) Outpatient Encounter Medications as of 05/13/2021  Medication Sig   Biotin 300 MCG TABS Take by mouth.   fexofenadine (ALLEGRA) 180 MG tablet Take 180 mg by mouth daily.   fluticasone (FLONASE) 50 MCG/ACT nasal spray Place into both nostrils daily.   levothyroxine (SYNTHROID) 88 MCG tablet TAKE ONE TABLET BY MOUTH EVERY MORNING BEFORE BREAKFAST   Multiple Vitamin (MULTIVITAMIN) tablet Take 1 tablet by mouth daily.   No facility-administered encounter medications on file as of 05/13/2021.    Allergies (verified) Codeine and Prednisone   History: Past Medical History:  Diagnosis Date   Allergy    Arthritis    Frequent headaches    Thyroid disease    Past Surgical History:  Procedure Laterality Date   APPENDECTOMY  1967   Family History  Problem Relation Age of Onset   Cancer Father    Heart disease Father    Stroke Father    Diabetes Father    Diabetes Sister    Cancer Sister    Cancer Maternal Grandmother    Breast cancer Maternal Grandmother    Social History   Socioeconomic History   Marital  status: Single    Spouse name: Not on file   Number of children: Not on file   Years of education: Not on file   Highest education level: Not on file  Occupational History   Not on file  Tobacco Use   Smoking status: Former    Types: Cigarettes    Quit date: 12/01/2013    Years since quitting: 7.4   Smokeless tobacco: Never  Substance and Sexual Activity   Alcohol use: No    Alcohol/week: 0.0 standard drinks   Drug use: No   Sexual activity: Never  Other Topics Concern   Not on file  Social History Narrative   Single   Employed at [employer] as a [employer]   Pets- 1 dog    Caffeine- Coffee 1 cup in the am and tea 20 oz throughout the days, no soda, uses splenda          Social Determinants of Health   Financial Resource Strain: Low Risk    Difficulty of Paying Living Expenses: Not hard at all  Food Insecurity: No Food Insecurity   Worried About Programme researcher, broadcasting/film/video in the Last Year: Never true   Ran Out of Food in the Last Year: Never true  Transportation Needs: No Transportation Needs   Lack  of Transportation (Medical): No   Lack of Transportation (Non-Medical): No  Physical Activity: Not on file  Stress: No Stress Concern Present   Feeling of Stress : Not at all  Social Connections: Unknown   Frequency of Communication with Friends and Family: More than three times a week   Frequency of Social Gatherings with Friends and Family: More than three times a week   Attends Religious Services: Not on Scientist, clinical (histocompatibility and immunogenetics) or Organizations: Not on file   Attends Banker Meetings: Not on file   Marital Status: Not on file    Tobacco Counseling Counseling given: Not Answered   Clinical Intake:  Pre-visit preparation completed: Yes        Diabetes: No  How often do you need to have someone help you when you read instructions, pamphlets, or other written materials from your doctor or pharmacy?: 1 - Never    Interpreter  Needed?: No      Activities of Daily Living    05/13/2021   12:55 PM  In your present state of health, do you have any difficulty performing the following activities:  Hearing? 0  Vision? 0  Difficulty concentrating or making decisions? 0  Walking or climbing stairs? 0  Dressing or bathing? 0  Doing errands, shopping? 0  Preparing Food and eating ? N  Using the Toilet? N  In the past six months, have you accidently leaked urine? N  Do you have problems with loss of bowel control? N  Managing your Medications? N  Managing your Finances? N  Housekeeping or managing your Housekeeping? N    Patient Care Team: Glori Luis, MD as PCP - General (Family Medicine)  Indicate any recent Medical Services you may have received from other than [facility] providers in the past year (date may be approximate).     Assessment:   This is a routine wellness examination for Meghan Higgins.  Virtual Visit via Telephone Note  I connected with  Meghan Higgins on 05/13/21 at 12:45 PM EDT by telephone and verified that I am speaking with the correct person using two identifiers.  Persons participating in the virtual visit: patient/Nurse Health Advisor   I discussed the limitations, risks, security and privacy concerns of performing an evaluation and management service by telephone and the availability of in person appointments. The patient expressed understanding and agreed to proceed.  Interactive audio and video telecommunications were attempted between this nurse and patient, however failed, due to patient having technical difficulties OR patient did not have access to video capability.  We continued and completed visit with audio only.  Some vital signs may be absent or patient reported.   Hearing/Vision screen Hearing Screening - Comments:: Patient is able to hear conversational tones without difficulty.  No issues reported. Vision Screening - Comments:: Followed by [facility] Wears  corrective lenses They have seen their ophthalmologist in the last 12 months.    Dietary issues and exercise activities discussed: Current Exercise Habits: Home exercise routine, Intensity: Moderate   Goals Addressed             This Visit's Progress    Maintain Healthy Lifestyle       Stay active Healthy diet       Depression Screen    05/13/2021   12:52 PM 03/24/2021    9:58 AM 02/12/2020   12:39 PM 01/24/2019    8:29 AM 02/24/2017   11:23 AM  PHQ 2/9 Scores  PHQ - 2 Score 0 0 0 0 0    Fall Risk    05/13/2021   12:55 PM 03/24/2021    9:57 AM 02/12/2020   12:39 PM 01/24/2019    8:28 AM  Fall Risk   Falls in the past year? 0 0 0 0  Number falls in past yr: 0 0 0 0  Injury with Fall?  0    Risk for fall due to :  No Fall Risks    Follow up Falls evaluation completed Falls evaluation completed Falls evaluation completed Falls evaluation completed    FALL RISK PREVENTION PERTAINING TO THE HOME: Home free of loose throw rugs in walkways, pet beds, electrical cords, etc? Yes  Adequate lighting in your home to reduce risk of falls? Yes   ASSISTIVE DEVICES UTILIZED TO PREVENT FALLS: Life alert? No  Use of a cane, walker or w/c? No   TIMED UP AND GO: Was the test performed? No .   Cognitive Function:  Patient is alert and oriented x3.       Immunizations Immunization History  Administered Date(s) Administered   Hepatitis A, Adult 08/30/2018   Influenza, Quadrivalent, Recombinant, Inj, Pf 08/30/2018   Influenza-Unspecified 10/13/2015, 10/19/2016, 09/12/2019, 09/30/2020   PFIZER(Purple Top)SARS-COV-2 Vaccination 03/21/2019, 04/11/2019, 11/29/2019   Pfizer Covid Bivalent Pediatric Vaccine(82mos to <24yrs) 09/30/2020   Zoster Recombinat (Shingrix) 07/30/2017, 09/30/2017    TDAP status: Due, Education has been provided regarding the importance of this vaccine. Advised may receive this vaccine at local pharmacy or Health Dept. Aware to provide a copy of the vaccination  record if obtained from local pharmacy or Health Dept. Verbalized acceptance and understanding. Deferred.   Pneumococcal vaccine status: Due, Education has been provided regarding the importance of this vaccine. Advised may receive this vaccine at local pharmacy or Health Dept. Aware to provide a copy of the vaccination record if obtained from local pharmacy or Health Dept. Verbalized acceptance and understanding.Deferred.   Patient notes possibly receiving these vaccines at a local pharmacy and will update immunization record accordingly.   Screening Tests Health Maintenance  Topic Date Due   MAMMOGRAM  08/08/2019   DEXA SCAN  Never done   Pneumonia Vaccine 11+ Years old (1 - PCV) 05/14/2022 (Originally 05/03/2020)   TETANUS/TDAP  05/14/2022 (Originally 05/04/1974)   INFLUENZA VACCINE  08/11/2021   Fecal DNA (Cologuard)  03/21/2023   COVID-19 Vaccine  Completed   Hepatitis C Screening  Completed   Zoster Vaccines- Shingrix  Completed   HPV VACCINES  Aged Out   Health Maintenance Health Maintenance Due  Topic Date Due   MAMMOGRAM  08/08/2019   DEXA SCAN  Never done   Lung Cancer Screening: (Low Dose CT Chest recommended if Age 48-80 years, 30 pack-year currently smoking OR have quit w/in 15years.) does not qualify.   Vision Screening: Recommended annual ophthalmology exams for early detection of glaucoma and other disorders of the eye.  Dental Screening: Recommended annual dental exams for proper oral hygiene  Community Resource Referral / Chronic Care Management: CRR required this visit?  No   CCM required this visit?  No      Plan:   Keep all routine maintenance appointments.   I have personally reviewed and noted the following in the patient's chart:   Medical and social history Use of alcohol, tobacco or illicit drugs  Current medications and supplements including opioid prescriptions. Patient is not currently taking opioid prescriptions. Functional ability and  status Nutritional status Physical activity  Advanced directives List of other physicians Hospitalizations, surgeries, and ER visits in previous 12 months Vitals Screenings to include cognitive, depression, and falls Referrals and appointments  In addition, I have reviewed and discussed with patient certain preventive protocols, quality metrics, and best practice recommendations. A written personalized care plan for preventive services as well as general preventive health recommendations were provided to patient.     Ashok Pall, LPN   01/16/1094

## 2021-05-14 ENCOUNTER — Ambulatory Visit
Admission: RE | Admit: 2021-05-14 | Discharge: 2021-05-14 | Disposition: A | Discharge status: Home / Self Care | Payer: Medicare Other | Source: Ambulatory Visit | Attending: Family Medicine | Admitting: Family Medicine

## 2021-05-14 DIAGNOSIS — Z780 Asymptomatic menopausal state: Secondary | ICD-10-CM | POA: Insufficient documentation

## 2021-05-14 DIAGNOSIS — Z1231 Encounter for screening mammogram for malignant neoplasm of breast: Secondary | ICD-10-CM | POA: Diagnosis not present

## 2021-05-14 DIAGNOSIS — M85851 Other specified disorders of bone density and structure, right thigh: Secondary | ICD-10-CM | POA: Diagnosis not present

## 2021-05-15 ENCOUNTER — Other Ambulatory Visit: Payer: Self-pay | Admitting: Family Medicine

## 2021-05-15 DIAGNOSIS — N63 Unspecified lump in unspecified breast: Secondary | ICD-10-CM

## 2021-05-15 DIAGNOSIS — R928 Other abnormal and inconclusive findings on diagnostic imaging of breast: Secondary | ICD-10-CM

## 2021-05-26 ENCOUNTER — Ambulatory Visit
Admission: RE | Admit: 2021-05-26 | Discharge: 2021-05-26 | Disposition: A | Discharge status: Home / Self Care | Payer: Medicare Other | Source: Ambulatory Visit | Attending: Family Medicine | Admitting: Family Medicine

## 2021-05-26 DIAGNOSIS — N6323 Unspecified lump in the left breast, lower outer quadrant: Secondary | ICD-10-CM | POA: Diagnosis not present

## 2021-05-26 DIAGNOSIS — R928 Other abnormal and inconclusive findings on diagnostic imaging of breast: Secondary | ICD-10-CM | POA: Insufficient documentation

## 2021-05-26 DIAGNOSIS — N63 Unspecified lump in unspecified breast: Secondary | ICD-10-CM | POA: Insufficient documentation

## 2021-07-07 ENCOUNTER — Other Ambulatory Visit: Payer: Self-pay

## 2021-07-07 DIAGNOSIS — E039 Hypothyroidism, unspecified: Secondary | ICD-10-CM

## 2021-07-07 MED ORDER — LEVOTHYROXINE SODIUM 88 MCG PO TABS: 88.0000 ug | ORAL_TABLET | Freq: Every day | ORAL | 1 refills | Status: DC

## 2021-09-29 DIAGNOSIS — H16223 Keratoconjunctivitis sicca, not specified as Sjogren's, bilateral: Secondary | ICD-10-CM | POA: Diagnosis not present

## 2021-09-29 DIAGNOSIS — H43811 Vitreous degeneration, right eye: Secondary | ICD-10-CM | POA: Diagnosis not present

## 2021-09-29 DIAGNOSIS — H2513 Age-related nuclear cataract, bilateral: Secondary | ICD-10-CM | POA: Diagnosis not present

## 2022-01-02 ENCOUNTER — Other Ambulatory Visit: Payer: Self-pay | Admitting: Family Medicine

## 2022-01-02 DIAGNOSIS — E039 Hypothyroidism, unspecified: Secondary | ICD-10-CM

## 2022-03-29 ENCOUNTER — Encounter: Payer: Self-pay | Admitting: Family Medicine

## 2022-03-29 ENCOUNTER — Ambulatory Visit (INDEPENDENT_AMBULATORY_CARE_PROVIDER_SITE_OTHER): Payer: Medicare Other | Admitting: Family Medicine

## 2022-03-29 VITALS — BP 116/76 | HR 81 | Temp 98.3°F | Ht 65.0 in | Wt 148.2 lb

## 2022-03-29 DIAGNOSIS — E7800 Pure hypercholesterolemia, unspecified: Secondary | ICD-10-CM

## 2022-03-29 DIAGNOSIS — E039 Hypothyroidism, unspecified: Secondary | ICD-10-CM | POA: Diagnosis not present

## 2022-03-29 DIAGNOSIS — Z130 Encounter for screening for diseases of the blood and blood-forming organs and certain disorders involving the immune mechanism: Secondary | ICD-10-CM

## 2022-03-29 DIAGNOSIS — Z0000 Encounter for general adult medical examination without abnormal findings: Secondary | ICD-10-CM

## 2022-03-29 DIAGNOSIS — R928 Other abnormal and inconclusive findings on diagnostic imaging of breast: Secondary | ICD-10-CM

## 2022-03-29 LAB — COMPREHENSIVE METABOLIC PANEL
ALT: 27 U/L (ref 0–35)
AST: 22 U/L (ref 0–37)
Albumin: 4.4 g/dL (ref 3.5–5.2)
Alkaline Phosphatase: 70 U/L (ref 39–117)
BUN: 21 mg/dL (ref 6–23)
CO2: 29 mEq/L (ref 19–32)
Calcium: 9.7 mg/dL (ref 8.4–10.5)
Chloride: 101 mEq/L (ref 96–112)
Creatinine, Ser: 0.87 mg/dL (ref 0.40–1.20)
GFR: 69.19 mL/min (ref >60.00)
Glucose, Bld: 78 mg/dL (ref 70–99)
Potassium: 4.4 mEq/L (ref 3.5–5.1)
Sodium: 139 mEq/L (ref 135–145)
Total Bilirubin: 0.4 mg/dL (ref 0.2–1.2)
Total Protein: 6.7 g/dL (ref 6.0–8.3)

## 2022-03-29 LAB — TSH: TSH: 0.63 u[IU]/mL (ref 0.35–5.50)

## 2022-03-29 LAB — LDL CHOLESTEROL, DIRECT: Direct LDL: 146 mg/dL

## 2022-03-29 LAB — CBC
HCT: 44 % (ref 36.0–46.0)
Hemoglobin: 14.6 g/dL (ref 12.0–15.0)
MCHC: 33.2 g/dL (ref 30.0–36.0)
MCV: 86.7 fl (ref 78.0–100.0)
Platelets: 232 10*3/uL (ref 150.0–400.0)
RBC: 5.07 Mil/uL (ref 3.87–5.11)
RDW: 13.8 % (ref 11.5–15.5)
WBC: 4.8 10*3/uL (ref 4.0–10.5)

## 2022-03-29 LAB — LIPID PANEL
Cholesterol: 214 mg/dL — ABNORMAL HIGH (ref 0–200)
HDL: 39.4 mg/dL (ref >39.00)
NonHDL: 174.33
Total CHOL/HDL Ratio: 5
Triglycerides: 294 mg/dL — ABNORMAL HIGH (ref 0.0–149.0)
VLDL: 58.8 mg/dL — ABNORMAL HIGH (ref 0.0–40.0)

## 2022-03-29 NOTE — Patient Instructions (Signed)
Nice to see you. Somebody should call you to schedule your mammogram. Please check on your tetanus vaccine status and if you are due please get this. Will get lab work today and contact you with the results.

## 2022-03-29 NOTE — Assessment & Plan Note (Addendum)
Physical exam completed.  Encouraged healthy diet and exercise.  Discussed her diet seems to be fairly healthy at this time.  She needs to increase her exercise.  Advised if she develops postmenopausal bleeding she should let us know.  She was previously supposed to have a follow-up breast ultrasound though it appears she never was contacted about this.  I have placed an order for diagnostic mammogram and left breast ultrasound and advise she would be called to schedule this.  If she is not called within 2 weeks she will let us know.  She will check on her tetanus vaccine and get this at the pharmacy if she is due for this.  Lab work as outlined.

## 2022-03-29 NOTE — Progress Notes (Signed)
Tommi Rumps, MD Phone: [phone]  Meghan Higgins is a 67 y.o. female who presents today for CPE.  Diet: "crappy", lots of chicken, no fired foods, no fast food, occasional pringles, small amounts of ice cream nightly, plenty of vegetables Exercise: not much recently as she has been caring for her mother who broke her hip, plans to increase activity Pap smear: aged out, reports no prior abnormals Cologuard: 03/29/20 Mammogram: due Family history-  Colon cancer: no  Breast cancer: maternal granmother  Ovarian cancer: no Menses: postmenopausal Vaccines-   Flu: UTD  Tetanus: due   Shingles: UTD  COVID19: UTD  Pneumonia: UTD  RSV: UTD Hep C Screening: UTD Tobacco use: no Alcohol use: no Illicit Drug use: no Dentist: yes Ophthalmology: yes  .  Active Ambulatory Problems    Diagnosis Date Noted   Hypothyroidism 12/02/2014   Routine general medical examination at a health care facility 01/01/2016   Abnormal mammogram 02/24/2017   Allergic rhinitis 02/24/2017   Elevated LDL cholesterol level 07/21/2018   Chronic neck pain 03/24/2021   Resolved Ambulatory Problems    Diagnosis Date Noted   Encounter to establish care 12/02/2014   Sinus headache 12/02/2014   Acute bronchitis 11/06/2015   Chigger bites 07/21/2018   Past Medical History:  Diagnosis Date   Allergy    Arthritis    Frequent headaches    Thyroid disease     Family History  Problem Relation Age of Onset   Cancer Father    Heart disease Father    Stroke Father    Diabetes Father    Diabetes Sister    Cancer Sister    Cancer Maternal Grandmother    Breast cancer Maternal Grandmother     Social History   Socioeconomic History   Marital status: Single    Spouse name: Not on file   Number of children: Not on file   Years of education: Not on file   Highest education level: Not on file  Occupational History   Not on file  Tobacco Use   Smoking status: Former    Types: Cigarettes    Quit  date: 12/01/2013    Years since quitting: 8.3   Smokeless tobacco: Never  Substance and Sexual Activity   Alcohol use: No    Alcohol/week: 0.0 standard drinks of alcohol   Drug use: No   Sexual activity: Never  Other Topics Concern   Not on file  Social History Narrative   Single   Employed at [employer] as a [employer]   Pets- 1 dog    Caffeine- Coffee 1 cup in the am and tea 20 oz throughout the days, no soda, uses splenda          Social Determinants of Health   Financial Resource Strain: Low Risk  (05/13/2021)   Overall Financial Resource Strain (CARDIA)    Difficulty of Paying Living Expenses: Not hard at all  Food Insecurity: No Food Insecurity (05/13/2021)   Hunger Vital Sign    Worried About Running Out of Food in the Last Year: Never true    [place] in the Last Year: Never true  Transportation Needs: No Transportation Needs (05/13/2021)   PRAPARE - Hydrologist (Medical): No    Lack of Transportation (Non-Medical): No  Physical Activity: Not on file  Stress: No Stress Concern Present (05/13/2021)   [place]  Feeling of Stress : Not at all  Social Connections: Unknown (05/13/2021)   Social Connection and Isolation Panel [NHANES]    Frequency of Communication with Friends and Family: More than three times a week    Frequency of Social Gatherings with Friends and Family: More than three times a week    Attends Religious Services: Not on file    Active Member of [place] or Organizations: Not on file    Attends Archivist Meetings: Not on file    Marital Status: Not on file  Intimate Partner Violence: Not At Risk (05/13/2021)   Humiliation, Afraid, Rape, and Kick questionnaire    Fear of Current or Ex-Partner: No    Emotionally Abused: No    Physically Abused: No    Sexually Abused: No    ROS  General:  Negative for nexplained weight loss,  fever Skin: Negative for new or changing mole, sore that won't heal HEENT: Negative for trouble hearing, trouble seeing, ringing in ears, mouth sores, hoarseness, change in voice, dysphagia. CV:  Negative for chest pain, dyspnea, edema, palpitations Resp: Negative for cough, dyspnea, hemoptysis GI: Negative for nausea, vomiting, diarrhea, constipation, abdominal pain, melena, hematochezia. GU: Negative for dysuria, incontinence, urinary hesitance, hematuria, vaginal or penile discharge, polyuria, sexual difficulty, lumps in testicle or breasts MSK: Negative for muscle cramps or aches, joint pain or swelling Neuro: Negative for headaches, weakness, numbness, dizziness, passing out/fainting Psych: Negative for depression, anxiety, memory problems  Objective  Physical Exam Vitals:   03/29/22 1016  BP: 116/76  Pulse: 81  Temp: 98.3 F (36.8 C)  SpO2: 96%    BP Readings from Last 3 Encounters:  03/29/22 116/76  05/12/21 120/80  03/24/21 120/80   Wt Readings from Last 3 Encounters:  03/29/22 148 lb 3.2 oz (67.2 kg)  05/13/21 139 lb (63 kg)  05/12/21 139 lb 12.8 oz (63.4 kg)    Physical Exam Constitutional:      General: She is not in acute distress.    Appearance: She is not diaphoretic.  HENT:     Head: Normocephalic and atraumatic.  Cardiovascular:     Rate and Rhythm: Normal rate and regular rhythm.     Heart sounds: Normal heart sounds.  Pulmonary:     Effort: Pulmonary effort is normal.     Breath sounds: Normal breath sounds.  Abdominal:     General: Bowel sounds are normal. There is no distension.     Palpations: Abdomen is soft.     Tenderness: There is no abdominal tenderness.  Musculoskeletal:     Right lower leg: No edema.     Left lower leg: No edema.  Lymphadenopathy:     Cervical: No cervical adenopathy.  Skin:    General: Skin is warm and dry.  Neurological:     Mental Status: She is alert.  Psychiatric:        Mood and Affect: Mood normal.       Assessment/Plan:   Routine general medical examination at a health care facility Assessment & Plan: Physical exam completed.  Encouraged healthy diet and exercise.  Discussed her diet seems to be fairly healthy at this time.  She needs to increase her exercise.  Advised if she develops postmenopausal bleeding she should let us know.  She was previously supposed to have a follow-up breast ultrasound though it appears she never was contacted about this.  I have placed an order for diagnostic mammogram and left breast ultrasound and advise she would  be called to schedule this.  If she is not called within 2 weeks she will let us know.  She will check on her tetanus vaccine and get this at the pharmacy if she is due for this.  Lab work as outlined.   Hypothyroidism, unspecified type -     TSH  Elevated LDL cholesterol level -     Comprehensive metabolic panel -     Lipid panel  Abnormal mammogram -     US BREAST COMPLETE UNI LEFT INC AXILLA; Future -     MM 3D DIAGNOSTIC MAMMOGRAM BILATERAL BREAST; Future  Screening for deficiency anemia -     CBC    Return in about 1 year (around 03/29/2023) for physical.   Tommi Rumps, MD [place]

## 2022-04-06 ENCOUNTER — Telehealth: Payer: Self-pay

## 2022-04-06 ENCOUNTER — Ambulatory Visit
Admission: RE | Admit: 2022-04-06 | Discharge: 2022-04-06 | Disposition: A | Discharge status: Home / Self Care | Payer: Medicare Other | Source: Ambulatory Visit | Attending: Family Medicine | Admitting: Family Medicine

## 2022-04-06 DIAGNOSIS — R928 Other abnormal and inconclusive findings on diagnostic imaging of breast: Secondary | ICD-10-CM

## 2022-04-06 DIAGNOSIS — N6323 Unspecified lump in the left breast, lower outer quadrant: Secondary | ICD-10-CM | POA: Diagnosis not present

## 2022-04-06 NOTE — Telephone Encounter (Signed)
Pt returning call for labs 

## 2022-04-06 NOTE — Telephone Encounter (Signed)
-----   Message from Leone Haven, MD sent at 04/05/2022  9:59 AM EDT ----- Please relay the mychart result message. Thanks.

## 2022-04-06 NOTE — Telephone Encounter (Signed)
Left message to call the office back regarding lab results  

## 2022-04-07 ENCOUNTER — Telehealth: Payer: Self-pay

## 2022-04-07 NOTE — Telephone Encounter (Signed)
Left message to call the office back regarding lab results  

## 2022-04-07 NOTE — Telephone Encounter (Signed)
-----   Message from Leone Haven, MD sent at 04/05/2022  9:59 AM EDT ----- Please relay the mychart result message. Thanks.

## 2022-04-14 DIAGNOSIS — M542 Cervicalgia: Secondary | ICD-10-CM | POA: Diagnosis not present

## 2022-04-14 DIAGNOSIS — M25519 Pain in unspecified shoulder: Secondary | ICD-10-CM | POA: Diagnosis not present

## 2022-04-21 DIAGNOSIS — M542 Cervicalgia: Secondary | ICD-10-CM | POA: Diagnosis not present

## 2022-04-21 DIAGNOSIS — M25519 Pain in unspecified shoulder: Secondary | ICD-10-CM | POA: Diagnosis not present

## 2022-05-05 DIAGNOSIS — M25519 Pain in unspecified shoulder: Secondary | ICD-10-CM | POA: Diagnosis not present

## 2022-05-05 DIAGNOSIS — M542 Cervicalgia: Secondary | ICD-10-CM | POA: Diagnosis not present

## 2022-05-22 DIAGNOSIS — M25519 Pain in unspecified shoulder: Secondary | ICD-10-CM | POA: Diagnosis not present

## 2022-05-22 DIAGNOSIS — M542 Cervicalgia: Secondary | ICD-10-CM | POA: Diagnosis not present

## 2022-06-02 DIAGNOSIS — M25519 Pain in unspecified shoulder: Secondary | ICD-10-CM | POA: Diagnosis not present

## 2022-06-02 DIAGNOSIS — M542 Cervicalgia: Secondary | ICD-10-CM | POA: Diagnosis not present

## 2022-07-13 ENCOUNTER — Telehealth: Payer: Self-pay | Admitting: Family Medicine

## 2022-07-13 DIAGNOSIS — E039 Hypothyroidism, unspecified: Secondary | ICD-10-CM

## 2022-07-13 MED ORDER — LEVOTHYROXINE SODIUM 88 MCG PO TABS: 88.0000 ug | ORAL_TABLET | Freq: Every day | ORAL | 1 refills | Status: DC

## 2022-07-13 NOTE — Telephone Encounter (Signed)
Phone call to pt to let her know that refill has been sent into [facility].

## 2022-07-13 NOTE — Telephone Encounter (Signed)
Refill sent.

## 2022-07-13 NOTE — Telephone Encounter (Signed)
Prescription Request  07/13/2022  LOV: 03/29/2022  What is the name of the medication or equipment? levothyroxine (SYNTHROID) 88 MCG tablet. Patient came into office and stated that her pharmacy has called this medication into office, since last week. She is out of her medication as of today. Pharmacy has already gave her 5 pills and now she is out.   Have you contacted your pharmacy to request a refill? Yes   Which pharmacy would you like this sent to?  [id number] [address], [place] - [address] [place] [address] Phone: [phone] Fax: [phone]    Patient notified that their request is being sent to the clinical staff for review and that they should receive a response within 2 business days.   Please advise at Mobile [phone] (mobile)

## 2022-09-22 ENCOUNTER — Ambulatory Visit (INDEPENDENT_AMBULATORY_CARE_PROVIDER_SITE_OTHER): Payer: Medicare Other

## 2022-09-22 ENCOUNTER — Ambulatory Visit
Admission: EM | Admit: 2022-09-22 | Discharge: 2022-09-22 | Disposition: A | Discharge status: Home / Self Care | Payer: Medicare Other | Attending: Emergency Medicine | Admitting: Emergency Medicine

## 2022-09-22 DIAGNOSIS — J189 Pneumonia, unspecified organism: Secondary | ICD-10-CM | POA: Insufficient documentation

## 2022-09-22 DIAGNOSIS — R062 Wheezing: Secondary | ICD-10-CM | POA: Diagnosis not present

## 2022-09-22 DIAGNOSIS — R051 Acute cough: Secondary | ICD-10-CM

## 2022-09-22 DIAGNOSIS — Z1152 Encounter for screening for COVID-19: Secondary | ICD-10-CM | POA: Insufficient documentation

## 2022-09-22 DIAGNOSIS — R918 Other nonspecific abnormal finding of lung field: Secondary | ICD-10-CM | POA: Diagnosis not present

## 2022-09-22 MED ORDER — AZITHROMYCIN 250 MG PO TABS
250.0000 mg | ORAL_TABLET | Freq: Every day | ORAL | 0 refills | Status: DC
Start: 2022-09-22 — End: 2022-12-10

## 2022-09-22 MED ORDER — ALBUTEROL SULFATE HFA 108 (90 BASE) MCG/ACT IN AERS
2.0000 | INHALATION_SPRAY | Freq: Once | RESPIRATORY_TRACT | Status: AC
  Administered 2022-09-22: 2 via RESPIRATORY_TRACT

## 2022-09-22 MED ORDER — AMOXICILLIN-POT CLAVULANATE 875-125 MG PO TABS: 1.0000 | ORAL_TABLET | Freq: Two times a day (BID) | ORAL | 0 refills | Status: DC

## 2022-09-22 MED ORDER — AEROCHAMBER PLUS FLO-VU MEDIUM MISC
1.0000 | Freq: Once | Status: AC
  Administered 2022-09-22: 1

## 2022-09-22 MED ORDER — ALBUTEROL SULFATE (2.5 MG/3ML) 0.083% IN NEBU
2.5000 mg | INHALATION_SOLUTION | Freq: Once | RESPIRATORY_TRACT | Status: AC
  Administered 2022-09-22: 2.5 mg via RESPIRATORY_TRACT

## 2022-09-22 NOTE — ED Triage Notes (Signed)
Patient to Urgent Care with complaints of  sinus pressure- pain into her teeth and ears. Feels congestion is settling in her chest. Reports persistent productive cough.  Symptoms started Monday.   Has been taking Mucinex/ robitussin.

## 2022-09-22 NOTE — Discharge Instructions (Addendum)
Use the albuterol inhaler as directed.  Follow-up with your primary care provider tomorrow.  Go to the emergency department if you have worsening symptoms.    Your COVID test is pending.

## 2022-09-22 NOTE — ED Provider Notes (Signed)
Meghan Higgins    CSN: [id number] Arrival date & time: 09/22/22  1535      History   Chief Complaint Chief Complaint  Patient presents with   Nasal Congestion    HPI Meghan Higgins is a 67 y.o. female.  Patient presents with 2-day history of postnasal drip, sinus pressure, chest congestion, productive cough, wheezing.  She reports mild shortness of breath also.  Treatment attempted with Mucinex and Robitussin.  She denies fever, chest pain, or other symptoms.  Her medical history includes allergies, hypothyroidism, arthritis.  Former smoker.  The history is provided by the patient and medical records.    Past Medical History:  Diagnosis Date   Allergy    Arthritis    Frequent headaches    Thyroid disease     Patient Active Problem List   Diagnosis Date Noted   Chronic neck pain 03/24/2021   Elevated LDL cholesterol level 07/21/2018   Abnormal mammogram 02/24/2017   Allergic rhinitis 02/24/2017   Routine general medical examination at a health care facility 01/01/2016   Hypothyroidism 12/02/2014    Past Surgical History:  Procedure Laterality Date   APPENDECTOMY  01/11/1965   BREAST BIOPSY Left 08/17/2018   Venus clip - PAPILLARY APOCRINE METAPLASIA AND FIBROCYSTIC CHANGES. - NEGATIVE   BREAST BIOPSY Right     OB History   No obstetric history on file.      Home Medications    Prior to Admission medications   Medication Sig Start Date End Date Taking? Authorizing Provider  amoxicillin-clavulanate (AUGMENTIN) 875-125 MG tablet Take 1 tablet by mouth every 12 (twelve) hours. 09/22/22  Yes Mickie Bail, NP  azithromycin (ZITHROMAX) 250 MG tablet Take 1 tablet (250 mg total) by mouth daily. Take first 2 tablets together, then 1 every day until finished. 09/22/22  Yes Mickie Bail, NP  fexofenadine (ALLEGRA) 180 MG tablet Take 180 mg by mouth daily.    [provider]  fluticasone (FLONASE) 50 MCG/ACT nasal spray Place into both nostrils daily.     [provider]  levothyroxine (SYNTHROID) 88 MCG tablet Take 1 tablet (88 mcg total) by mouth daily with breakfast. 07/13/22   Glori Luis, MD  Multiple Vitamin (MULTIVITAMIN) tablet Take 1 tablet by mouth daily.    [provider]    Family History Family History  Problem Relation Age of Onset   Cancer Father    Heart disease Father    Stroke Father    Diabetes Father    Diabetes Sister    Cancer Sister    Cancer Maternal Grandmother    Breast cancer Maternal Grandmother     Social History Social History   Tobacco Use   Smoking status: Former    Current packs/day: 0.00    Types: Cigarettes    Quit date: 12/01/2013    Years since quitting: 8.8   Smokeless tobacco: Never  Substance Use Topics   Alcohol use: No    Alcohol/week: 0.0 standard drinks of alcohol   Drug use: No     Allergies   Codeine and Prednisone   Review of Systems Review of Systems  Constitutional:  Negative for chills and fever.  HENT:  Positive for congestion, postnasal drip and sinus pressure. Negative for ear pain and sore throat.   Respiratory:  Positive for cough and shortness of breath.   Cardiovascular:  Negative for chest pain and palpitations.  Gastrointestinal:  Negative for diarrhea and vomiting.     Physical  Exam Triage Vital Signs ED Triage Vitals  Encounter Vitals Group     BP --      Systolic BP Percentile --      Diastolic BP Percentile --      Pulse Rate 09/22/22 1628 94     Resp 09/22/22 1628 18     Temp 09/22/22 1628 98.4 F (36.9 C)     Temp src --      SpO2 09/22/22 1628 92 %     Weight --      Height --      Head Circumference --      Peak Flow --      Pain Score 09/22/22 1637 5     Pain Loc --      Pain Education --      Exclude from Growth Chart --    No data found.  Updated Vital Signs BP 118/75   Pulse 94   Temp 98.4 F (36.9 C)   Resp 18   SpO2 94%   Visual Acuity Right Eye Distance:   Left Eye Distance:   Bilateral  Distance:    Right Eye Near:   Left Eye Near:    Bilateral Near:     Physical Exam Vitals and nursing note reviewed.  Constitutional:      General: She is not in acute distress.    Appearance: She is well-developed.  HENT:     Right Ear: Tympanic membrane normal.     Left Ear: Tympanic membrane normal.     Nose: Nose normal.     Mouth/Throat:     Mouth: Mucous membranes are moist.     Pharynx: Oropharynx is clear.  Cardiovascular:     Rate and Rhythm: Normal rate and regular rhythm.     Heart sounds: Normal heart sounds.  Pulmonary:     Effort: Pulmonary effort is normal. No respiratory distress.     Breath sounds: Decreased air movement present. Wheezing present.     Comments: Decreased air movement.  Wheezing throughout.  Musculoskeletal:     Cervical back: Neck supple.  Skin:    General: Skin is warm and dry.  Neurological:     Mental Status: She is alert.  Psychiatric:        Mood and Affect: Mood normal.        Behavior: Behavior normal.      UC Treatments / Results  Labs (all labs ordered are listed, but only abnormal results are displayed) Labs Reviewed  SARS CORONAVIRUS 2 (TAT 6-24 HRS)    EKG   Radiology DG Chest 2 View  Result Date: 09/22/2022 CLINICAL DATA:  Cough wheezing short of breath EXAM: CHEST - 2 VIEW COMPARISON:  02/12/2009 FINDINGS: The heart size and mediastinal contours are within normal limits. No acute focal airspace disease. Vague right upper lobe opacity indeterminate for focus of scarring or inflammatory infiltrate. Normal cardiac size. No pneumothorax. The visualized skeletal structures are unremarkable. IMPRESSION: Vague right upper lobe opacity indeterminate for focus of scarring or inflammatory infiltrate/pneumonia. Suggest short interval radiographic follow-up in 4-6 weeks following appropriate therapy. Electronically Signed   By: Jasmine Pang M.D.   On: 09/22/2022 18:38    Procedures Procedures (including critical care  time)  Medications Ordered in UC Medications  albuterol (PROVENTIL) (2.5 MG/3ML) 0.083% nebulizer solution 2.5 mg (2.5 mg Nebulization Given 09/22/22 1707)  albuterol (VENTOLIN HFA) 108 (90 Base) MCG/ACT inhaler 2 puff (2 puffs Inhalation Given 09/22/22 1729)  AeroChamber Plus Flo-Vu  Medium MISC 1 each (1 each Other Given 09/22/22 1729)    Initial Impression / Assessment and Plan / UC Course  I have reviewed the triage vital signs and the nursing notes.  Pertinent labs & imaging results that were available during my care of the patient were reviewed by me and considered in my medical decision making (see chart for details).   Right upper lobe pneumonia, cough, wheezing.  Chest x-ray shows right upper lobe pneumonia.  Discussed x-ray findings with patient and also discussed recommendation for repeat chest x-ray in 4 to 6 weeks.  Albuterol nebulizer treatment given here.  After albuterol nebulizer treatment, patient has improved air movement and O2 sat improved to 94% on room air.  Discharging home with albuterol inhaler with spacer.  Patient is not able to take prednisone.  Treating pneumonia with Augmentin and Zithromax.  COVID pending.  Instructed patient to follow-up with her PCP tomorrow.  ED precautions provided.  Education provided on pneumonia.  She agrees to plan of care.  Final Clinical Impressions(s) / UC Diagnoses   Final diagnoses:  Acute cough  Wheezing  Pneumonia of right upper lobe due to infectious organism     Discharge Instructions      Use the albuterol inhaler as directed.  Follow-up with your primary care provider tomorrow.  Go to the emergency department if you have worsening symptoms.    Your COVID test is pending.     ED Prescriptions     Medication Sig Dispense Auth. Provider   amoxicillin-clavulanate (AUGMENTIN) 875-125 MG tablet Take 1 tablet by mouth every 12 (twelve) hours. 14 tablet Mickie Bail, NP   azithromycin (ZITHROMAX) 250 MG tablet Take 1  tablet (250 mg total) by mouth daily. Take first 2 tablets together, then 1 every day until finished. 6 tablet Mickie Bail, NP      PDMP not reviewed this encounter.   Mickie Bail, NP 09/22/22 1901

## 2022-09-23 ENCOUNTER — Telehealth: Payer: Self-pay | Admitting: Emergency Medicine

## 2022-09-23 LAB — SARS CORONAVIRUS 2 (TAT 6-24 HRS): SARS Coronavirus 2: NEGATIVE

## 2022-09-23 MED ORDER — BENZONATATE 100 MG PO CAPS: 100.0000 mg | ORAL_CAPSULE | Freq: Three times a day (TID) | ORAL | 0 refills | Status: DC | PRN

## 2022-09-23 NOTE — Telephone Encounter (Signed)
Telephone call to patient to discuss negative COVID test result.  Per patient request, Jerilynn Som sent to pharmacy for cough.  She has not started the antibiotics yet but will pick them up at the pharmacy this morning and get those started.  Instructed her to follow-up with her PCP today.  ED precautions given.  She agrees to plan of care.

## 2022-09-24 ENCOUNTER — Ambulatory Visit (INDEPENDENT_AMBULATORY_CARE_PROVIDER_SITE_OTHER): Payer: Medicare Other | Admitting: Family Medicine

## 2022-09-24 ENCOUNTER — Encounter: Payer: Self-pay | Admitting: Family Medicine

## 2022-09-24 VITALS — BP 122/78 | HR 104 | Temp 98.2°F | Ht 65.0 in | Wt 142.8 lb

## 2022-09-24 DIAGNOSIS — J189 Pneumonia, unspecified organism: Secondary | ICD-10-CM | POA: Insufficient documentation

## 2022-09-24 MED ORDER — CEFPODOXIME PROXETIL 200 MG PO TABS
200.0000 mg | ORAL_TABLET | Freq: Two times a day (BID) | ORAL | 0 refills | Status: DC
Start: 2022-09-24 — End: 2022-12-10

## 2022-09-24 NOTE — Progress Notes (Signed)
Marikay Alar, MD Phone: [phone]  Meghan Higgins is a 67 y.o. female who presents today for same-day visit.  Community-acquired pneumonia: Patient was evaluated at urgent care for 2-day history of feeling sick.  She had postnasal drip, sinus pressure, chest congestion, cough, and wheezing.  She had mild shortness of breath.  She was found to have possible pneumonia and started on Augmentin and azithromycin.  She continues to have cough and wheezing.  She notes some dyspnea on exertion though no shortness of breath if she is sitting still.  No fevers.  She notes the Augmentin has caused her to vomit and she would like an alternative to that.  She notes a little stomach upset with the azithromycin.  Social History   Tobacco Use  Smoking Status Former   Current packs/day: 0.00   Types: Cigarettes   Quit date: 12/01/2013   Years since quitting: 8.8  Smokeless Tobacco Never    Current Outpatient Medications on File Prior to Visit  Medication Sig Dispense Refill   azithromycin (ZITHROMAX) 250 MG tablet Take 1 tablet (250 mg total) by mouth daily. Take first 2 tablets together, then 1 every day until finished. 6 tablet 0   benzonatate (TESSALON) 100 MG capsule Take 1 capsule (100 mg total) by mouth 3 (three) times daily as needed for cough. 21 capsule 0   fexofenadine (ALLEGRA) 180 MG tablet Take 180 mg by mouth daily.     fluticasone (FLONASE) 50 MCG/ACT nasal spray Place into both nostrils daily.     levothyroxine (SYNTHROID) 88 MCG tablet Take 1 tablet (88 mcg total) by mouth daily with breakfast. 90 tablet 1   Multiple Vitamin (MULTIVITAMIN) tablet Take 1 tablet by mouth daily.     No current facility-administered medications on file prior to visit.     ROS see history of present illness  Objective  Physical Exam Vitals:   09/24/22 1043  BP: 122/78  Pulse: (!) 104  Temp: 98.2 F (36.8 C)  SpO2: 94%    BP Readings from Last 3 Encounters:  09/24/22 122/78  09/22/22  118/75  03/29/22 116/76   Wt Readings from Last 3 Encounters:  09/24/22 142 lb 12.8 oz (64.8 kg)  03/29/22 148 lb 3.2 oz (67.2 kg)  05/13/21 139 lb (63 kg)    Physical Exam Constitutional:      General: She is not in acute distress.    Appearance: She is not diaphoretic.  Cardiovascular:     Rate and Rhythm: Normal rate and regular rhythm.     Heart sounds: Normal heart sounds.  Pulmonary:     Effort: Pulmonary effort is normal.     Breath sounds: Wheezing (Inspiratory wheezes throughout) present.  Skin:    General: Skin is warm and dry.  Neurological:     Mental Status: She is alert.      Assessment/Plan: Please see individual problem list.  Community acquired pneumonia, unspecified laterality Assessment & Plan: Patient with community-acquired pneumonia based on symptoms and x-ray imaging.  We will switch her Augmentin over to cefpodoxime 200 mg twice daily for 7 days.  She will finish her course of the azithromycin.  If not improving by early next week she will let us know.  If she has any worsening symptoms she will be evaluated in the emergency department.  She will return in 6 weeks for follow-up x-ray to confirm clearance of infection.  Orders: -     Cefpodoxime Proxetil; Take 1 tablet (200 mg total) by mouth  2 (two) times daily.  Dispense: 14 tablet; Refill: 0 -     DG Chest 2 View; Future   Return in about 6 weeks (around 11/05/2022) for Chest x-ray.   Marikay Alar, MD [facility]

## 2022-09-24 NOTE — Assessment & Plan Note (Addendum)
Patient with community-acquired pneumonia based on symptoms and x-ray imaging.  We will switch her Augmentin over to cefpodoxime 200 mg twice daily for 7 days.  She will finish her course of the azithromycin.  If not improving by early next week she will let us know.  If she has any worsening symptoms she will be evaluated in the emergency department.  She will return in 6 weeks for follow-up x-ray to confirm clearance of infection.

## 2022-09-24 NOTE — Patient Instructions (Signed)
Nice to see you.  We are going to switch you to cefpodoxime.  You will stop the Augmentin.  If you are not able to tolerate cefpodoxime please let us know.  If it is too expensive please let me know.  If you are not improving by early next week please let us know.  If you have worsening symptoms please go to the emergency room.

## 2022-10-05 DIAGNOSIS — H43811 Vitreous degeneration, right eye: Secondary | ICD-10-CM | POA: Diagnosis not present

## 2022-10-05 DIAGNOSIS — H2513 Age-related nuclear cataract, bilateral: Secondary | ICD-10-CM | POA: Diagnosis not present

## 2022-10-05 DIAGNOSIS — H16223 Keratoconjunctivitis sicca, not specified as Sjogren's, bilateral: Secondary | ICD-10-CM | POA: Diagnosis not present

## 2022-11-05 ENCOUNTER — Other Ambulatory Visit: Payer: Medicare Other

## 2022-11-05 ENCOUNTER — Other Ambulatory Visit: Payer: Self-pay | Admitting: Medical Genetics

## 2022-11-05 DIAGNOSIS — Z006 Encounter for examination for normal comparison and control in clinical research program: Secondary | ICD-10-CM

## 2022-11-16 ENCOUNTER — Other Ambulatory Visit
Admission: RE | Admit: 2022-11-16 | Discharge: 2022-11-16 | Disposition: A | Discharge status: Home / Self Care | Payer: Medicare Other | Source: Ambulatory Visit | Attending: Medical Genetics | Admitting: Medical Genetics

## 2022-11-16 DIAGNOSIS — Z006 Encounter for examination for normal comparison and control in clinical research program: Secondary | ICD-10-CM | POA: Insufficient documentation

## 2022-11-26 LAB — GENECONNECT MOLECULAR SCREEN

## 2022-11-26 LAB — HELIX MOLECULAR SCREEN: Genetic Analysis Overall Interpretation: NEGATIVE

## 2022-12-10 ENCOUNTER — Other Ambulatory Visit: Payer: Self-pay

## 2022-12-10 ENCOUNTER — Encounter: Payer: Self-pay | Admitting: Emergency Medicine

## 2022-12-10 ENCOUNTER — Ambulatory Visit
Admission: EM | Admit: 2022-12-10 | Discharge: 2022-12-10 | Disposition: A | Discharge status: Home / Self Care | Payer: Medicare Other | Attending: Emergency Medicine | Admitting: Emergency Medicine

## 2022-12-10 DIAGNOSIS — J0100 Acute maxillary sinusitis, unspecified: Secondary | ICD-10-CM | POA: Diagnosis not present

## 2022-12-10 MED ORDER — CEFDINIR 300 MG PO CAPS: 300.0000 mg | ORAL_CAPSULE | Freq: Two times a day (BID) | ORAL | 0 refills | Status: AC

## 2022-12-10 NOTE — ED Triage Notes (Addendum)
Reports day 10 of a sinus headache, runny nose and congestion into chest.    Has taken advil, flonase, and OTC sinus medicine, also takes Careers adviser

## 2022-12-10 NOTE — ED Provider Notes (Signed)
Meghan Higgins    CSN: [id number] Arrival date & time: 12/10/22  0805      History   Chief Complaint Chief Complaint  Patient presents with   URI    HPI Meghan Higgins is a 67 y.o. female.  Patient presents with 10-day history of sinus pressure, congestion, runny nose, cough, headache.  Treatment attempted with OTC sinus and allergy medications.  No fever, chest pain, shortness of breath, or other symptoms.  Patient was seen here on 09/22/2022; diagnosed with right upper lobe pneumonia, cough, wheezing; treated with Augmentin and Zithromax.  She was seen by her PCP on 09/24/2022; the Augmentin was discontinued due to GI upset (vomiting) and patient was started on cefpodoxime.  The history is provided by the patient and medical records.    Past Medical History:  Diagnosis Date   Allergy    Arthritis    Frequent headaches    Thyroid disease     Patient Active Problem List   Diagnosis Date Noted   Community acquired pneumonia 09/24/2022   Chronic neck pain 03/24/2021   Elevated LDL cholesterol level 07/21/2018   Abnormal mammogram 02/24/2017   Allergic rhinitis 02/24/2017   Routine general medical examination at a health care facility 01/01/2016   Hypothyroidism 12/02/2014    Past Surgical History:  Procedure Laterality Date   APPENDECTOMY  01/11/1965   BREAST BIOPSY Left 08/17/2018   Venus clip - PAPILLARY APOCRINE METAPLASIA AND FIBROCYSTIC CHANGES. - NEGATIVE   BREAST BIOPSY Right     OB History   No obstetric history on file.      Home Medications    Prior to Admission medications   Medication Sig Start Date End Date Taking? Authorizing Provider  cefdinir (OMNICEF) 300 MG capsule Take 1 capsule (300 mg total) by mouth 2 (two) times daily for 7 days. 12/10/22 12/17/22 Yes Mickie Bail, NP  fexofenadine (ALLEGRA) 180 MG tablet Take 180 mg by mouth daily.    [provider]  fluticasone (FLONASE) 50 MCG/ACT nasal spray Place into both nostrils  daily.    [provider]  levothyroxine (SYNTHROID) 88 MCG tablet Take 1 tablet (88 mcg total) by mouth daily with breakfast. 07/13/22   Glori Luis, MD  Multiple Vitamin (MULTIVITAMIN) tablet Take 1 tablet by mouth daily.    [provider]    Family History Family History  Problem Relation Age of Onset   Cancer Father    Heart disease Father    Stroke Father    Diabetes Father    Diabetes Sister    Cancer Sister    Cancer Maternal Grandmother    Breast cancer Maternal Grandmother     Social History Social History   Tobacco Use   Smoking status: Former    Current packs/day: 0.00    Types: Cigarettes    Quit date: 12/01/2013    Years since quitting: 9.0   Smokeless tobacco: Never  Vaping Use   Vaping status: Never Used  Substance Use Topics   Alcohol use: No    Alcohol/week: 0.0 standard drinks of alcohol   Drug use: No     Allergies   Codeine and Prednisone   Review of Systems Review of Systems  Constitutional:  Negative for chills and fever.  HENT:  Positive for congestion, postnasal drip, rhinorrhea and sinus pressure. Negative for ear pain and sore throat.   Respiratory:  Positive for cough. Negative for shortness of breath.   Cardiovascular:  Negative for chest  pain and palpitations.  Neurological:  Positive for headaches.     Physical Exam Triage Vital Signs ED Triage Vitals  Encounter Vitals Group     BP 12/10/22 0821 125/85     Systolic BP Percentile --      Diastolic BP Percentile --      Pulse Rate 12/10/22 0821 (!) 101     Resp 12/10/22 0821 18     Temp 12/10/22 0821 99.3 F (37.4 C)     Temp Source 12/10/22 0821 Oral     SpO2 12/10/22 0821 94 %     Weight --      Height --      Head Circumference --      Peak Flow --      Pain Score 12/10/22 0818 8     Pain Loc --      Pain Education --      Exclude from Growth Chart --    No data found.  Updated Vital Signs BP 125/85 (BP Location: Left Arm)   Pulse (!)  101   Temp 99.3 F (37.4 C) (Oral)   Resp 18   SpO2 94%   Visual Acuity Right Eye Distance:   Left Eye Distance:   Bilateral Distance:    Right Eye Near:   Left Eye Near:    Bilateral Near:     Physical Exam Constitutional:      General: She is not in acute distress. HENT:     Right Ear: Tympanic membrane normal.     Left Ear: Tympanic membrane normal.     Nose: Congestion and rhinorrhea present.     Mouth/Throat:     Mouth: Mucous membranes are moist.     Pharynx: Oropharynx is clear.  Cardiovascular:     Rate and Rhythm: Normal rate and regular rhythm.     Heart sounds: Normal heart sounds.  Pulmonary:     Effort: Pulmonary effort is normal. No respiratory distress.     Breath sounds: Normal breath sounds.  Skin:    General: Skin is warm and dry.  Neurological:     Mental Status: She is alert.      UC Treatments / Results  Labs (all labs ordered are listed, but only abnormal results are displayed) Labs Reviewed - No data to display  EKG   Radiology No results found.  Procedures Procedures (including critical care time)  Medications Ordered in UC Medications - No data to display  Initial Impression / Assessment and Plan / UC Course  I have reviewed the triage vital signs and the nursing notes.  Pertinent labs & imaging results that were available during my care of the patient were reviewed by me and considered in my medical decision making (see chart for details).    Acute sinusitis.  Patient has been symptomatic for 10 days.  Treating today with cefdinir.  Education provided on sinus infection.  Instructed patient to follow up with her PCP if her symptoms are not improving.  She agrees to plan of care.   Final Clinical Impressions(s) / UC Diagnoses   Final diagnoses:  Acute non-recurrent maxillary sinusitis     Discharge Instructions      Take the cefdinir as directed.  Follow-up with your primary care provider if your symptoms are not  improving.      ED Prescriptions     Medication Sig Dispense Auth. Provider   cefdinir (OMNICEF) 300 MG capsule Take 1 capsule (300 mg total) by mouth  2 (two) times daily for 7 days. 14 capsule Mickie Bail, NP      PDMP not reviewed this encounter.   Mickie Bail, NP 12/10/22 [phone]

## 2022-12-10 NOTE — Discharge Instructions (Addendum)
Take the cefdinir as directed.  Follow up with your primary care provider if your symptoms are not improving.

## 2022-12-10 NOTE — ED Triage Notes (Signed)
Patient thinks she is allergic to zithromax due to vomiting when she took this last time

## 2022-12-12 ENCOUNTER — Encounter: Payer: Self-pay | Admitting: Emergency Medicine

## 2022-12-12 ENCOUNTER — Ambulatory Visit
Admission: EM | Admit: 2022-12-12 | Discharge: 2022-12-12 | Disposition: A | Discharge status: Home / Self Care | Payer: Medicare Other | Attending: Physician Assistant | Admitting: Physician Assistant

## 2022-12-12 DIAGNOSIS — J9801 Acute bronchospasm: Secondary | ICD-10-CM

## 2022-12-12 DIAGNOSIS — R0602 Shortness of breath: Secondary | ICD-10-CM | POA: Diagnosis not present

## 2022-12-12 DIAGNOSIS — J189 Pneumonia, unspecified organism: Secondary | ICD-10-CM

## 2022-12-12 MED ORDER — FLUTICASONE-SALMETEROL 115-21 MCG/ACT IN AERO
2.0000 | INHALATION_SPRAY | Freq: Two times a day (BID) | RESPIRATORY_TRACT | 0 refills | Status: DC
Start: 2022-12-12 — End: 2023-03-08

## 2022-12-12 MED ORDER — AZITHROMYCIN 250 MG PO TABS: 250.0000 mg | ORAL_TABLET | Freq: Every day | ORAL | 0 refills | Status: DC

## 2022-12-12 MED ORDER — BENZONATATE 100 MG PO CAPS: 100.0000 mg | ORAL_CAPSULE | Freq: Three times a day (TID) | ORAL | 0 refills | Status: DC

## 2022-12-12 MED ORDER — ALBUTEROL SULFATE HFA 108 (90 BASE) MCG/ACT IN AERS
1.0000 | INHALATION_SPRAY | Freq: Four times a day (QID) | RESPIRATORY_TRACT | 0 refills | Status: DC | PRN
Start: 2022-12-12 — End: 2023-03-06

## 2022-12-12 MED ORDER — IPRATROPIUM-ALBUTEROL 0.5-2.5 (3) MG/3ML IN SOLN
3.0000 mL | Freq: Once | RESPIRATORY_TRACT | Status: AC
  Administered 2022-12-12: 3 mL via RESPIRATORY_TRACT

## 2022-12-12 NOTE — ED Provider Notes (Signed)
[name]    CSN: [id number] Arrival date & time: 12/12/22  1315      History   Chief Complaint Chief Complaint  Patient presents with   Cough   chest congestion    HPI Meghan Higgins is a 67 y.o. female.   Patient presents today with a several week history of URI symptoms that have worsened in the past several days.  She reports that for the past few weeks she has had congestion and sinus pressure.  She was seen by our clinic on 12/10/2022 for evaluation after she was diagnosed with a infection and started on Augmentin.  She had nausea and vomiting with this medication so discontinued it in place of cefpodoxime and then will switch to [facility].  She denies additional antibiotics in the past 90 days outside of what was prescribed in the past few weeks.  She has been using over-the-counter medication including Mucinex and Flonase without improvement of symptoms.  She does report multiple close contacts that have ultimately been diagnosed with pneumonia.  She denies history of asthma or COPD but has required breathing medications in the past.  She is a former smoker but quit several years ago.  She is unable to tolerate prednisone due to associated bradycardia but has taken Advair and other inhaled corticosteroids without difficulty.  She is eating and drinking normally.    Past Medical History:  Diagnosis Date   Allergy    Arthritis    Frequent headaches    Thyroid disease     Patient Active Problem List   Diagnosis Date Noted   Community acquired pneumonia 09/24/2022   Chronic neck pain 03/24/2021   Elevated LDL cholesterol level 07/21/2018   Abnormal mammogram 02/24/2017   Allergic rhinitis 02/24/2017   Routine general medical examination at a health care facility 01/01/2016   Hypothyroidism 12/02/2014    Past Surgical History:  Procedure Laterality Date   APPENDECTOMY  01/11/1965   BREAST BIOPSY Left 08/17/2018   Venus clip - PAPILLARY APOCRINE METAPLASIA AND  FIBROCYSTIC CHANGES. - NEGATIVE   BREAST BIOPSY Right     OB History   No obstetric history on file.      Home Medications    Prior to Admission medications   Medication Sig Start Date End Date Taking? Authorizing Provider  albuterol (VENTOLIN HFA) 108 (90 Base) MCG/ACT inhaler Inhale 1-2 puffs into the lungs every 6 (six) hours as needed for wheezing or shortness of breath. 12/12/22  Yes Aleda Madl K, PA-C  azithromycin (ZITHROMAX) 250 MG tablet Take 1 tablet (250 mg total) by mouth daily. Take first 2 tablets together, then 1 every day until finished. 12/12/22  Yes Argelio Granier K, PA-C  benzonatate (TESSALON) 100 MG capsule Take 1 capsule (100 mg total) by mouth every 8 (eight) hours. 12/12/22  Yes Alexandru Moorer K, PA-C  fluticasone-salmeterol (ADVAIR HFA) 115-21 MCG/ACT inhaler Inhale 2 puffs into the lungs 2 (two) times daily. 12/12/22  Yes Amari Burnsworth K, PA-C  cefdinir (OMNICEF) 300 MG capsule Take 1 capsule (300 mg total) by mouth 2 (two) times daily for 7 days. 12/10/22 12/17/22  Mickie Bail, NP  fexofenadine (ALLEGRA) 180 MG tablet Take 180 mg by mouth daily.    [provider]  fluticasone (FLONASE) 50 MCG/ACT nasal spray Place into both nostrils daily.    [provider]  levothyroxine (SYNTHROID) 88 MCG tablet Take 1 tablet (88 mcg total) by mouth daily with breakfast. 07/13/22   Glori Luis,  MD  Multiple Vitamin (MULTIVITAMIN) tablet Take 1 tablet by mouth daily.    [provider]    Family History Family History  Problem Relation Age of Onset   Cancer Father    Heart disease Father    Stroke Father    Diabetes Father    Diabetes Sister    Cancer Sister    Cancer Maternal Grandmother    Breast cancer Maternal Grandmother     Social History Social History   Tobacco Use   Smoking status: Former    Current packs/day: 0.00    Types: Cigarettes    Quit date: 12/01/2013    Years since quitting: 9.0   Smokeless tobacco: Never   Vaping Use   Vaping status: Never Used  Substance Use Topics   Alcohol use: No    Alcohol/week: 0.0 standard drinks of alcohol   Drug use: No     Allergies   Codeine and Prednisone   Review of Systems Review of Systems  Constitutional:  Positive for activity change. Negative for appetite change, fatigue and fever.  HENT:  Positive for congestion and sinus pressure. Negative for sneezing and sore throat.   Respiratory:  Positive for cough, shortness of breath and wheezing.   Cardiovascular:  Negative for chest pain.  Gastrointestinal:  Negative for abdominal pain, diarrhea, nausea and vomiting.  Neurological:  Positive for headaches. Negative for dizziness and light-headedness.     Physical Exam Triage Vital Signs ED Triage Vitals  Encounter Vitals Group     BP 12/12/22 1429 115/75     Systolic BP Percentile --      Diastolic BP Percentile --      Pulse Rate 12/12/22 1429 94     Resp 12/12/22 1429 18     Temp 12/12/22 1429 98.1 F (36.7 C)     Temp Source 12/12/22 1429 Oral     SpO2 12/12/22 1429 91 %     Weight --      Height --      Head Circumference --      Peak Flow --      Pain Score 12/12/22 1428 0     Pain Loc --      Pain Education --      Exclude from Growth Chart --    No data found.  Updated Vital Signs BP 115/75 (BP Location: Right Arm)   Pulse 94   Temp 98.1 F (36.7 C) (Oral)   Resp 18   SpO2 91%   Visual Acuity Right Eye Distance:   Left Eye Distance:   Bilateral Distance:    Right Eye Near:   Left Eye Near:    Bilateral Near:     Physical Exam Vitals reviewed.  Constitutional:      General: She is awake. She is not in acute distress.    Appearance: Normal appearance. She is well-developed. She is not ill-appearing.     Comments: Very pleasant female appears stated age in no acute distress sitting comfortably in exam room  HENT:     Head: Normocephalic and atraumatic.     Right Ear: Tympanic membrane, ear canal and external ear  normal. Tympanic membrane is not erythematous or bulging.     Left Ear: Tympanic membrane, ear canal and external ear normal. Tympanic membrane is not erythematous or bulging.     Nose:     Right Sinus: No maxillary sinus tenderness or frontal sinus tenderness.     Left Sinus: No maxillary  sinus tenderness or frontal sinus tenderness.     Mouth/Throat:     Pharynx: Uvula midline. Postnasal drip present. No oropharyngeal exudate or posterior oropharyngeal erythema.  Cardiovascular:     Rate and Rhythm: Normal rate and regular rhythm.     Heart sounds: Normal heart sounds, S1 normal and S2 normal. No murmur heard. Pulmonary:     Effort: Pulmonary effort is normal.     Breath sounds: Wheezing and rhonchi present. No rales.     Comments: Widespread rhonchi and wheezing Psychiatric:        Behavior: Behavior is cooperative.      UC Treatments / Results  Labs (all labs ordered are listed, but only abnormal results are displayed) Labs Reviewed - No data to display  EKG   Radiology No results found.  Procedures Procedures (including critical care time)  Medications Ordered in UC Medications  ipratropium-albuterol (DUONEB) 0.5-2.5 (3) MG/3ML nebulizer solution 3 mL (3 mLs Nebulization Given 12/12/22 1455)    Initial Impression / Assessment and Plan / UC Course  I have reviewed the triage vital signs and the nursing notes.  Pertinent labs & imaging results that were available during my care of the patient were reviewed by me and considered in my medical decision making (see chart for details).     Patient is well-appearing, afebrile, nontoxic, nontachycardic.  She did have significant wheezing on exam that improved following a DuoNeb.  I am concerned for underlying COPD given her history of smoking but she denies formal diagnosis of this.  Will treat for COPD exacerbation as well as atypical pneumonia given increased incidence of mycoplasma pneumonia in the community.  She was  given albuterol to be used every 4-6 hours as needed as well as started on Advair.  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  She was encouraged to use this as prescribed and rinse her mouth to prevent thrush.  We did discuss potential utility of x-ray but given we are covering for CAP with previously prescribed Omnicef and current azithromycin this was deferred.  We discussed that if her symptoms are not improving quickly she would need to return for reevaluation at which point we will consider additional testing.  She can use over-the-counter medications for symptom management including Mucinex, Flonase, Tylenol.  She was given Tessalon for cough.  Recommend that she rest and drink plenty of fluid.  Discussed that if anything worsens or changes she must be seen immediately.  Strict return precautions given.  Excuse note provided.  Final Clinical Impressions(s) / UC Diagnoses   Final diagnoses:  Atypical pneumonia  Bronchospasm  SOB (shortness of breath)     Discharge Instructions      I am concerned that you have atypical pneumonia.  You can continue the cefdinir but I would also like to add azithromycin as prescribed to cover for other bacteria.  Use Advair to help with your breathing.  Rinse her mouth following use of this medication to prevent thrush.  Use albuterol every 4-6 hours as needed.  Take Tessalon for cough.  If your symptoms are not improving within a few days you need to be reevaluated.  If anything worsens and you have worsening cough, shortness of breath, chest pain, nausea, vomiting you need to be seen immediately     ED Prescriptions     Medication Sig Dispense Auth. Provider   azithromycin (ZITHROMAX) 250 MG tablet Take  1 tablet (250 mg total) by mouth daily. Take first 2 tablets together, then 1 every day until finished. 6 tablet Latessa Tillis K, PA-C   albuterol  (VENTOLIN HFA) 108 (90 Base) MCG/ACT inhaler Inhale 1-2 puffs into the lungs every 6 (six) hours as needed for wheezing or shortness of breath. 18 g Domenic Schoenberger K, PA-C   benzonatate (TESSALON) 100 MG capsule Take 1 capsule (100 mg total) by mouth every 8 (eight) hours. 21 capsule Brandonlee Navis K, PA-C   fluticasone-salmeterol (ADVAIR HFA) 115-21 MCG/ACT inhaler Inhale 2 puffs into the lungs 2 (two) times daily. 12 g Camaria Gerald, Noberto Retort, PA-C      PDMP not reviewed this encounter.   Jeani Hawking, PA-C 12/12/22 1531

## 2022-12-12 NOTE — ED Triage Notes (Signed)
Pt c/o chest congestion that started yesterday. She was seen on 11/29 and states the drainage is going into her chest making it difficult to breath.

## 2022-12-12 NOTE — Discharge Instructions (Addendum)
I am concerned that you have atypical pneumonia.  You can continue the cefdinir but I would also like to add azithromycin as prescribed to cover for other bacteria.  Use Advair to help with your breathing.  Rinse her mouth following use of this medication to prevent thrush.  Use albuterol every 4-6 hours as needed.  Take Tessalon for cough.  If your symptoms are not improving within a few days you need to be reevaluated.  If anything worsens and you have worsening cough, shortness of breath, chest pain, nausea, vomiting you need to be seen immediately

## 2022-12-28 ENCOUNTER — Ambulatory Visit (INDEPENDENT_AMBULATORY_CARE_PROVIDER_SITE_OTHER): Payer: Medicare Other | Admitting: *Deleted

## 2022-12-28 VITALS — Ht 65.0 in | Wt 142.0 lb

## 2022-12-28 DIAGNOSIS — Z0000 Encounter for general adult medical examination without abnormal findings: Secondary | ICD-10-CM

## 2022-12-28 NOTE — Progress Notes (Signed)
Subjective:   Meghan Higgins is a 67 y.o. female who presents for Medicare Annual (Subsequent) preventive examination.  Visit Complete: Virtual I connected with  Meghan Higgins on 12/28/22 by a audio enabled telemedicine application and verified that I am speaking with the correct person using two identifiers.  Patient Location: Home  Provider Location: Office/Clinic  I discussed the limitations of evaluation and management by telemedicine. The patient expressed understanding and agreed to proceed.  Vital Signs: Because this visit was a virtual/telehealth visit, some criteria may be missing or patient reported. Any vitals not documented were not able to be obtained and vitals that have been documented are patient reported.  Patient Medicare AWV questionnaire was completed by the patient on 12/24/22; I have confirmed that all information answered by patient is correct and no changes since this date.  Cardiac Risk Factors include: advanced age (>34men, >18 women);dyslipidemia     Objective:    Today's Vitals   12/28/22 1455  Weight: 142 lb (64.4 kg)  Height: 5\' 5"  (1.651 m)   Body mass index is 23.63 kg/m.     12/28/2022    3:07 PM 09/22/2022    4:37 PM 05/13/2021   12:53 PM 04/07/2021    8:16 PM  Advanced Directives  Does Patient Have a Medical Advance Directive? No No No Yes  Type of Advance Directive    Living will  Does patient want to make changes to medical advance directive?    No - Patient declined  Would patient like information on creating a medical advance directive? No - Patient declined  No - Patient declined No - Patient declined    Current Medications (verified) Outpatient Encounter Medications as of 12/28/2022  Medication Sig   albuterol (VENTOLIN HFA) 108 (90 Base) MCG/ACT inhaler Inhale 1-2 puffs into the lungs every 6 (six) hours as needed for wheezing or shortness of breath.   fexofenadine (ALLEGRA) 180 MG tablet Take 180 mg by mouth daily.   fluticasone  (FLONASE) 50 MCG/ACT nasal spray Place into both nostrils daily.   fluticasone-salmeterol (ADVAIR HFA) 115-21 MCG/ACT inhaler Inhale 2 puffs into the lungs 2 (two) times daily.   levothyroxine (SYNTHROID) 88 MCG tablet Take 1 tablet (88 mcg total) by mouth daily with breakfast.   Multiple Vitamin (MULTIVITAMIN) tablet Take 1 tablet by mouth daily.   [DISCONTINUED] azithromycin (ZITHROMAX) 250 MG tablet Take 1 tablet (250 mg total) by mouth daily. Take first 2 tablets together, then 1 every day until finished. (Patient not taking: Reported on 12/28/2022)   [DISCONTINUED] benzonatate (TESSALON) 100 MG capsule Take 1 capsule (100 mg total) by mouth every 8 (eight) hours. (Patient not taking: Reported on 12/28/2022)   No facility-administered encounter medications on file as of 12/28/2022.    Allergies (verified) Codeine and Prednisone   History: Past Medical History:  Diagnosis Date   Allergy    Arthritis    Frequent headaches    Thyroid disease    Past Surgical History:  Procedure Laterality Date   APPENDECTOMY  01/11/1965   BREAST BIOPSY Left 08/17/2018   Venus clip - PAPILLARY APOCRINE METAPLASIA AND FIBROCYSTIC CHANGES. - NEGATIVE   BREAST BIOPSY Right    Family History  Problem Relation Age of Onset   Cancer Father    Heart disease Father    Stroke Father    Diabetes Father    Diabetes Sister    Cancer Sister    Cancer Maternal Grandmother    Breast cancer Maternal Grandmother  Social History   Socioeconomic History   Marital status: Single    Spouse name: Not on file   Number of children: Not on file   Years of education: Not on file   Highest education level: Some college, no degree  Occupational History   Not on file  Tobacco Use   Smoking status: Former    Current packs/day: 0.00    Types: Cigarettes    Quit date: 12/01/2013    Years since quitting: 9.0   Smokeless tobacco: Never  Vaping Use   Vaping status: Never Used  Substance and Sexual Activity    Alcohol use: No    Alcohol/week: 0.0 standard drinks of alcohol   Drug use: No   Sexual activity: Never  Other Topics Concern   Not on file  Social History Narrative   Single   Employed at [employer] as a [employer]   Pets- 1 dog    Caffeine- Coffee 1 cup in the am and tea 20 oz throughout the days, no soda, uses splenda          Social Drivers of Health   Financial Resource Strain: Low Risk  (12/24/2022)   Overall Financial Resource Strain (CARDIA)    Difficulty of Paying Living Expenses: Not hard at all  Food Insecurity: No Food Insecurity (12/24/2022)   Hunger Vital Sign    Worried About Running Out of Food in the Last Year: Never true    Ran Out of Food in the Last Year: Never true  Transportation Needs: No Transportation Needs (12/24/2022)   PRAPARE - Administrator, Civil Service (Medical): No    Lack of Transportation (Non-Medical): No  Physical Activity: Sufficiently Active (12/24/2022)   Exercise Vital Sign    Days of Exercise per Week: 7 days    Minutes of Exercise per Session: 30 min  Stress: No Stress Concern Present (12/24/2022)   [employer] of Occupational Health - Occupational Stress Questionnaire    Feeling of Stress : Not at all  Social Connections: Socially Isolated (12/24/2022)   Social Connection and Isolation Panel [NHANES]    Frequency of Communication with Friends and Family: Once a week    Frequency of Social Gatherings with Friends and Family: More than three times a week    Attends Religious Services: Never    Database administrator or Organizations: No    Attends Engineer, structural: Never    Marital Status: Never married    Tobacco Counseling Counseling given: Not Answered   Clinical Intake:  Pre-visit preparation completed: Yes  Pain : No/denies pain     BMI - recorded: 23.63 Nutritional Status: BMI of 19-24  Normal Nutritional Risks: None Diabetes: No  How often do you need to  have someone help you when you read instructions, pamphlets, or other written materials from your doctor or pharmacy?: 1 - Never  Interpreter Needed?: No  Information entered by :: R. Behr Cislo LPN   Activities of Daily Living    12/24/2022    3:55 PM  In your present state of health, do you have any difficulty performing the following activities:  Hearing? 0  Vision? 0  Comment glasses  Difficulty concentrating or making decisions? 0  Walking or climbing stairs? 0  Dressing or bathing? 0  Doing errands, shopping? 0  Preparing Food and eating ? N  Using the Toilet? N  In the past six months, have you accidently leaked urine? N  Do you have problems with loss of bowel control? N  Managing your Medications? N  Managing your Finances? N  Housekeeping or managing your Housekeeping? N    Patient Care Team: Glori Luis, MD as PCP - General (Family Medicine)  Indicate any recent Medical Services you may have received from other than [facility] providers in the past year (date may be approximate).     Assessment:   This is a routine wellness examination for Meghan Higgins.  Hearing/Vision screen Hearing Screening - Comments:: No issues Vision Screening - Comments:: glasses   Goals Addressed             This Visit's Progress    Patient Stated       Wants to continue to eat good and stay active       Depression Screen    12/28/2022    3:02 PM 09/24/2022   10:54 AM 03/29/2022   10:17 AM 05/13/2021   12:52 PM 03/24/2021    9:58 AM 02/12/2020   12:39 PM 01/24/2019    8:29 AM  PHQ 2/9 Scores  PHQ - 2 Score 0 0 0 0 0 0 0  PHQ- 9 Score 0 0 0        Fall Risk    12/24/2022    3:55 PM 09/24/2022   10:53 AM 03/29/2022   10:17 AM 05/13/2021   12:55 PM 03/24/2021    9:57 AM  Fall Risk   Falls in the past year? 0 0 0 0 0  Number falls in past yr: 0 0 0 0 0  Injury with Fall? 0 0 0  0  Risk for fall due to : No Fall Risks No Fall Risks No Fall Risks  No Fall Risks  Follow up Falls  prevention discussed;Falls evaluation completed Falls evaluation completed Falls evaluation completed Falls evaluation completed Falls evaluation completed    MEDICARE RISK AT HOME: Medicare Risk at Home Any stairs in or around the home?: Yes If so, are there any without handrails?: No Home free of loose throw rugs in walkways, pet beds, electrical cords, etc?: Yes Adequate lighting in your home to reduce risk of falls?: Yes Life alert?: No Use of a cane, walker or w/c?: No Grab bars in the bathroom?: No Shower chair or bench in shower?: No Elevated toilet seat or a handicapped toilet?: No   Cognitive Function:        12/28/2022    3:08 PM  6CIT Screen  What Year? 0 points  What month? 0 points  What time? 0 points  Count back from 20 0 points  Months in reverse 0 points  Repeat phrase 0 points  Total Score 0 points    Immunizations Immunization History  Administered Date(s) Administered   Hepatitis A, Adult 08/30/2018   Influenza, Quadrivalent, Recombinant, Inj, Pf 08/30/2018   Influenza-Unspecified 10/13/2015, 10/19/2016, 09/12/2019, 09/30/2020   PFIZER(Purple Top)SARS-COV-2 Vaccination 03/21/2019, 04/11/2019, 11/29/2019   Pfizer Covid Bivalent Pediatric Vaccine(2mos to <53yrs) 09/30/2020   Zoster Recombinant(Shingrix) 07/30/2017, 09/30/2017    TDAP status: Due, Education has been provided regarding the importance of this vaccine. Advised may receive this vaccine at local pharmacy or Health Dept. Aware to provide a copy of the vaccination record if obtained from local pharmacy or Health Dept. Verbalized acceptance and understanding.  Flu Vaccine status: Up to date  Pneumococcal vaccine status: Up to date  Covid-19 vaccine status: Completed vaccines  Qualifies for Shingles Vaccine? Yes   Zostavax completed No Shingrix Completed  Screening Tests Health Maintenance  Topic Date Due   DTaP/Tdap/Td (1 - Tdap) Never done   Pneumonia Vaccine 30+ Years old (1 of  1 - PCV) Never done   Medicare Annual Wellness (AWV)  05/14/2022   COVID-19 Vaccine (5 - 2024-25 season) 09/12/2022   MAMMOGRAM  10/07/2022   INFLUENZA VACCINE  04/11/2023 (Originally 08/12/2022)   Fecal DNA (Cologuard)  03/21/2023   DEXA SCAN  Completed   Hepatitis C Screening  Completed   Zoster Vaccines- Shingrix  Completed   HPV VACCINES  Aged Out    Health Maintenance  Health Maintenance Due  Topic Date Due   DTaP/Tdap/Td (1 - Tdap) Never done   Pneumonia Vaccine 31+ Years old (1 of 1 - PCV) Never done   Medicare Annual Wellness (AWV)  05/14/2022   COVID-19 Vaccine (5 - 2024-25 season) 09/12/2022   MAMMOGRAM  10/07/2022    Colorectal cancer screening: Type of screening: Cologuard. Completed 03/2020. Repeat every 3 years  Mammogram status: Completed 03/2022. Repeat every year. Patient stated that she was having them every 6 months but that may have changed because she has not gotten a letter  Bone Density status: Completed 05/2021. Results reflect: Bone density results: OSTEOPENIA. Repeat every 2 years.  Lung Cancer Screening: (Low Dose CT Chest recommended if Age 3-80 years, 20 pack-year currently smoking OR have quit w/in 15years.) does not qualify.   Additional Screening:  Hepatitis C Screening: does qualify; Completed 12/2015  Vision Screening: Recommended annual ophthalmology exams for early detection of glaucoma and other disorders of the eye. Is the patient up to date with their annual eye exam?  Yes  Who is the provider or what is the name of the office in which the patient attends annual eye exams? [facility] If pt is not established with a provider, would they like to be referred to a provider to establish care? No .   Dental Screening: Recommended annual dental exams for proper oral hygiene     Community Resource Referral / Chronic Care Management: CRR required this visit?  No   CCM required this visit?  No     Plan:     I have personally reviewed  and noted the following in the patient's chart:   Medical and social history Use of alcohol, tobacco or illicit drugs  Current medications and supplements including opioid prescriptions. Patient is not currently taking opioid prescriptions. Functional ability and status Nutritional status Physical activity Advanced directives List of other physicians Hospitalizations, surgeries, and ER visits in previous 12 months Vitals Screenings to include cognitive, depression, and falls Referrals and appointments  In addition, I have reviewed and discussed with patient certain preventive protocols, quality metrics, and best practice recommendations. A written personalized care plan for preventive services as well as general preventive health recommendations were provided to patient.     Sydell Axon, LPN   [id number]   After Visit Summary: (MyChart) Due to this being a telephonic visit, the after visit summary with patients personalized plan was offered to patient via MyChart   Nurse Notes: None

## 2022-12-28 NOTE — Patient Instructions (Signed)
Meghan Higgins , Thank you for taking time to come for your Medicare Wellness Visit. I appreciate your ongoing commitment to your health goals. Please review the following plan we discussed and let me know if I can assist you in the future.   Referrals/Orders/Follow-Ups/Clinician Recommendations: Remember to update your Tetanus (Tdap) Your other vaccines have been updated  This is a list of the screening recommended for you and due dates:  Health Maintenance  Topic Date Due   DTaP/Tdap/Td vaccine (1 - Tdap) Never done   Mammogram  10/07/2022   Cologuard (Stool DNA test)  03/21/2023   Medicare Annual Wellness Visit  12/28/2023   Pneumonia Vaccine  Completed   Flu Shot  Completed   DEXA scan (bone density measurement)  Completed   COVID-19 Vaccine  Completed   Hepatitis C Screening  Completed   Zoster (Shingles) Vaccine  Completed   HPV Vaccine  Aged Out    Advanced directives: (Declined) Advance directive discussed with you today. Even though you declined this today, please call our office should you change your mind, and we can give you the proper paperwork for you to fill out.  Next Medicare Annual Wellness Visit scheduled for next year: Yes 01/03/24 @ 2:20

## 2023-01-05 ENCOUNTER — Other Ambulatory Visit: Payer: Self-pay | Admitting: Family Medicine

## 2023-01-05 DIAGNOSIS — E039 Hypothyroidism, unspecified: Secondary | ICD-10-CM

## 2023-03-06 ENCOUNTER — Ambulatory Visit
Admission: EM | Admit: 2023-03-06 | Discharge: 2023-03-06 | Disposition: A | Discharge status: Home / Self Care | Payer: Medicare Other | Attending: Emergency Medicine | Admitting: Emergency Medicine

## 2023-03-06 DIAGNOSIS — J069 Acute upper respiratory infection, unspecified: Secondary | ICD-10-CM | POA: Diagnosis not present

## 2023-03-06 MED ORDER — ALBUTEROL SULFATE HFA 108 (90 BASE) MCG/ACT IN AERS: 1.0000 | INHALATION_SPRAY | Freq: Four times a day (QID) | RESPIRATORY_TRACT | 0 refills | Status: AC | PRN

## 2023-03-06 MED ORDER — AZITHROMYCIN 250 MG PO TABS: 250.0000 mg | ORAL_TABLET | Freq: Every day | ORAL | 0 refills | Status: DC

## 2023-03-06 NOTE — Discharge Instructions (Signed)
 Follow up with your primary care provider tomorrow.  Go to the emergency department if you have worsening symptoms.    Use the albuterol inhaler and take the Zithromax as directed.

## 2023-03-06 NOTE — ED Triage Notes (Addendum)
 HA, RIGHT ear pain, sore throat, drainage, nasal and chest congestion, productive cough with yellow mucus-wheezing-sob No fever Took sudafed this morning at 730

## 2023-03-06 NOTE — ED Provider Notes (Signed)
 Meghan Higgins    CSN: [id number] Arrival date & time: 03/06/23  4098      History   Chief Complaint Chief Complaint  Patient presents with   Sore Throat   Otalgia   post nasal drip   Nasal Congestion   Wheezing   Shortness of Breath    HPI Meghan Higgins is a 68 y.o. female.  Patient presents with 4-5 day history of ear pain, sore throat, congestion, sinus pressure, postnasal drip, chest congestion, productive cough.  She reports intermittent shortness of breath with exertion and coughing.  No fever, chest pain, vomiting, diarrhea.  She took Sudafed this morning.  Her medical history includes pneumonia.  The history is provided by the patient and medical records.    Past Medical History:  Diagnosis Date   Allergy    Arthritis    Frequent headaches    Thyroid disease     Patient Active Problem List   Diagnosis Date Noted   Community acquired pneumonia 09/24/2022   Chronic neck pain 03/24/2021   Elevated LDL cholesterol level 07/21/2018   Abnormal mammogram 02/24/2017   Allergic rhinitis 02/24/2017   Routine general medical examination at a health care facility 01/01/2016   Hypothyroidism 12/02/2014    Past Surgical History:  Procedure Laterality Date   APPENDECTOMY  01/11/1965   BREAST BIOPSY Left 08/17/2018   Venus clip - PAPILLARY APOCRINE METAPLASIA AND FIBROCYSTIC CHANGES. - NEGATIVE   BREAST BIOPSY Right     OB History   No obstetric history on file.      Home Medications    Prior to Admission medications   Medication Sig Start Date End Date Taking? Authorizing Provider  albuterol (VENTOLIN HFA) 108 (90 Base) MCG/ACT inhaler Inhale 1-2 puffs into the lungs every 6 (six) hours as needed. 03/06/23  Yes Mickie Bail, NP  azithromycin (ZITHROMAX) 250 MG tablet Take 1 tablet (250 mg total) by mouth daily. Take first 2 tablets together, then 1 every day until finished. 03/06/23  Yes Mickie Bail, NP  levothyroxine (SYNTHROID) 88 MCG tablet TAKE 1  TABLET BY MOUTH DAILY WITH BREAKFAST 01/06/23  Yes Glori Luis, MD  fexofenadine (ALLEGRA) 180 MG tablet Take 180 mg by mouth daily.    [provider]  fluticasone (FLONASE) 50 MCG/ACT nasal spray Place into both nostrils daily.    [provider]  fluticasone-salmeterol (ADVAIR HFA) 115-21 MCG/ACT inhaler Inhale 2 puffs into the lungs 2 (two) times daily. 12/12/22   Raspet, Noberto Retort, PA-C  Multiple Vitamin (MULTIVITAMIN) tablet Take 1 tablet by mouth daily.    [provider]    Family History Family History  Problem Relation Age of Onset   Cancer Father    Heart disease Father    Stroke Father    Diabetes Father    Diabetes Sister    Cancer Sister    Cancer Maternal Grandmother    Breast cancer Maternal Grandmother     Social History Social History   Tobacco Use   Smoking status: Former    Current packs/day: 0.00    Types: Cigarettes    Quit date: 12/01/2013    Years since quitting: 9.2   Smokeless tobacco: Never  Vaping Use   Vaping status: Never Used  Substance Use Topics   Alcohol use: No    Alcohol/week: 0.0 standard drinks of alcohol   Drug use: No     Allergies   Codeine, Prednisone, and Augmentin [amoxicillin-pot clavulanate]   Review  of Systems Review of Systems  Constitutional:  Negative for chills and fever.  HENT:  Positive for congestion, ear pain, postnasal drip, rhinorrhea, sinus pressure and sore throat.   Respiratory:  Positive for cough and shortness of breath.   Cardiovascular:  Negative for chest pain and palpitations.     Physical Exam Triage Vital Signs ED Triage Vitals [03/06/23 1053]  Encounter Vitals Group     BP      Systolic BP Percentile      Diastolic BP Percentile      Pulse      Resp      Temp      Temp src      SpO2      Weight      Height      Head Circumference      Peak Flow      Pain Score 6     Pain Loc      Pain Education      Exclude from Growth Chart    No data  found.  Updated Vital Signs BP 125/85 (BP Location: Left Arm)   Pulse 95   Temp 99.7 F (37.6 C) (Oral)   Resp 17   SpO2 95%   Visual Acuity Right Eye Distance:   Left Eye Distance:   Bilateral Distance:    Right Eye Near:   Left Eye Near:    Bilateral Near:     Physical Exam Constitutional:      General: She is not in acute distress. HENT:     Right Ear: Tympanic membrane normal.     Left Ear: Tympanic membrane normal.     Nose: Congestion and rhinorrhea present.     Mouth/Throat:     Mouth: Mucous membranes are moist.     Pharynx: Oropharynx is clear.  Cardiovascular:     Rate and Rhythm: Normal rate and regular rhythm.     Heart sounds: Normal heart sounds.  Pulmonary:     Effort: Pulmonary effort is normal. No respiratory distress.     Breath sounds: Wheezing and rhonchi present.     Comments: Upper airway wheezing and rhonchi. Neurological:     Mental Status: She is alert.      UC Treatments / Results  Labs (all labs ordered are listed, but only abnormal results are displayed) Labs Reviewed - No data to display  EKG   Radiology No results found.  Procedures Procedures (including critical care time)  Medications Ordered in UC Medications - No data to display  Initial Impression / Assessment and Plan / UC Course  I have reviewed the triage vital signs and the nursing notes.  Pertinent labs & imaging results that were available during my care of the patient were reviewed by me and considered in my medical decision making (see chart for details).    Acute upper respiratory infection.  O2 sat 93 to 95% on room air.  X-ray is not available onsite today and patient declines travel to another site for x-ray.  She declines transfer to the ED.  Treating today with albuterol inhaler and Zithromax.  Patient is unable to take prednisone.  Instructed her to follow-up with her PCP tomorrow.  Strict ED precautions given.  She agrees to plan of care.  Final  Clinical Impressions(s) / UC Diagnoses   Final diagnoses:  Acute upper respiratory infection     Discharge Instructions      Follow up with your primary care provider tomorrow.  Go to the emergency department if you have worsening symptoms.    Use the albuterol inhaler and take the Zithromax as directed.         ED Prescriptions     Medication Sig Dispense Auth. Provider   albuterol (VENTOLIN HFA) 108 (90 Base) MCG/ACT inhaler Inhale 1-2 puffs into the lungs every 6 (six) hours as needed. 18 g Mickie Bail, NP   azithromycin (ZITHROMAX) 250 MG tablet Take 1 tablet (250 mg total) by mouth daily. Take first 2 tablets together, then 1 every day until finished. 6 tablet Mickie Bail, NP      PDMP not reviewed this encounter.   Mickie Bail, NP 03/06/23 1120

## 2023-03-08 ENCOUNTER — Encounter: Payer: Self-pay | Admitting: Nurse Practitioner

## 2023-03-08 ENCOUNTER — Ambulatory Visit (INDEPENDENT_AMBULATORY_CARE_PROVIDER_SITE_OTHER): Payer: Medicare Other | Admitting: Nurse Practitioner

## 2023-03-08 VITALS — BP 116/72 | HR 103 | Temp 98.1°F | Ht 65.0 in | Wt 144.8 lb

## 2023-03-08 DIAGNOSIS — J069 Acute upper respiratory infection, unspecified: Secondary | ICD-10-CM | POA: Diagnosis not present

## 2023-03-08 MED ORDER — BENZONATATE 200 MG PO CAPS: 200.0000 mg | ORAL_CAPSULE | Freq: Three times a day (TID) | ORAL | 0 refills | Status: DC | PRN

## 2023-03-08 MED ORDER — FLUTICASONE-SALMETEROL 115-21 MCG/ACT IN AERO
2.0000 | INHALATION_SPRAY | Freq: Two times a day (BID) | RESPIRATORY_TRACT | 0 refills | Status: AC
Start: 2023-03-08 — End: ?

## 2023-03-08 MED ORDER — PSEUDOEPH-BROMPHEN-DM 30-2-10 MG/5ML PO SYRP
5.0000 mL | ORAL_SOLUTION | Freq: Four times a day (QID) | ORAL | 0 refills | Status: DC | PRN
Start: 2023-03-08 — End: 2023-04-05

## 2023-03-08 NOTE — Progress Notes (Signed)
 Bethanie Dicker, NP-C Phone: [phone]  Meghan Higgins Car is a 68 y.o. female who presents today for urgent care follow up.   Discussed the use of AI scribe software for clinical note transcription with the patient, who gave verbal consent to proceed.  History of Present Illness   Meghan Higgins is a 68 year old female who presents with symptoms of an upper respiratory infection. She will evaluated by Urgent Care two days ago.   Symptoms began last Wednesday after painting her bathroom, initially experiencing 'heartburn' described as stabbing pain through her chest into her back. The following day, she developed chest and head congestion, which has persisted for nearly a week. She continues to experience congestion in her chest and head, with significant post-nasal drainage, occasional sore throat, earaches, dental pain, headaches, and intermittent shortness of breath. No fever, body aches, or fatigue.  She was started on azithromycin and given an inhaler. Her symptoms are improving. She took one Sudafed on Sunday, which did not alleviate her symptoms. She is on day three of azithromycin, taking two tablets initially followed by one tablet daily for four days. She uses her inhaler approximately three times a day, particularly when walking her nine-month-old Shepherd puppy, which exacerbates her breathing difficulties.  She has a history of pneumonia, having had it twice before, and reports frequent sinus infections and bronchitis in her twenties while living in [place]. She experiences recurrent bronchitis and pneumonia when exposed to certain scents and fragrances and has been advised to avoid prednisone due to adverse reactions. She is allergic to Augmentin, which causes vomiting.  She works at [employer] where flu and pneumonia have been circulating.      Social History   Tobacco Use  Smoking Status Former   Current packs/day: 0.00   Types: Cigarettes   Quit date: 12/01/2013   Years  since quitting: 9.2  Smokeless Tobacco Never    Current Outpatient Medications on File Prior to Visit  Medication Sig Dispense Refill   albuterol (VENTOLIN HFA) 108 (90 Base) MCG/ACT inhaler Inhale 1-2 puffs into the lungs every 6 (six) hours as needed. 18 g 0   azithromycin (ZITHROMAX) 250 MG tablet Take 1 tablet (250 mg total) by mouth daily. Take first 2 tablets together, then 1 every day until finished. 6 tablet 0   fexofenadine (ALLEGRA) 180 MG tablet Take 180 mg by mouth daily.     fluticasone (FLONASE) 50 MCG/ACT nasal spray Place into both nostrils daily.     levothyroxine (SYNTHROID) 88 MCG tablet TAKE 1 TABLET BY MOUTH DAILY WITH BREAKFAST 90 tablet 1   Multiple Vitamin (MULTIVITAMIN) tablet Take 1 tablet by mouth daily.     No current facility-administered medications on file prior to visit.    ROS see history of present illness  Objective  Physical Exam Vitals:   03/08/23 0929  BP: 116/72  Pulse: (!) 103  Temp: 98.1 F (36.7 C)  SpO2: 92%    BP Readings from Last 3 Encounters:  03/08/23 116/72  03/06/23 125/85  12/12/22 115/75   Wt Readings from Last 3 Encounters:  03/08/23 144 lb 12.8 oz (65.7 kg)  12/28/22 142 lb (64.4 kg)  09/24/22 142 lb 12.8 oz (64.8 kg)    Physical Exam Constitutional:      General: She is not in acute distress.    Appearance: Normal appearance.  HENT:     Head: Normocephalic.     Right Ear: Tympanic membrane normal.     Left Ear:  Tympanic membrane normal.     Nose: Congestion present.     Mouth/Throat:     Mouth: Mucous membranes are moist.     Pharynx: Oropharynx is clear.  Beirne:     Conjunctiva/sclera: Conjunctivae normal.     Pupils: Pupils are equal, round, and reactive to light.  Cardiovascular:     Rate and Rhythm: Normal rate and regular rhythm.     Heart sounds: Normal heart sounds.  Pulmonary:     Effort: Pulmonary effort is normal.     Comments: Coarse breath sounds throughout Lymphadenopathy:     Cervical:  No cervical adenopathy.  Skin:    General: Skin is warm and dry.  Neurological:     General: No focal deficit present.     Mental Status: She is alert.  Psychiatric:        Mood and Affect: Mood normal.        Behavior: Behavior normal.    Assessment/Plan: Please see individual problem list.  Upper respiratory tract infection, unspecified type Assessment & Plan: Symptoms began a week ago with chest and head congestion, cough, and postnasal drip, but no fever or body aches. Improvement is noted with azithromycin and albuterol inhaler. Continue azithromycin for two more days and use the albuterol inhaler as needed, up to 2 puffs every 4-6 hours. Add Advair inhaler for additional bronchodilation. Prescribe Bromfed DM and Tessalon for cough. Counseled on common side effects such as drowsiness. Advised adequate hydration. If symptoms do not continue to improve or worsen, contact the office. Seek immediate medical attention if extreme shortness of breath or difficulty breathing.   Orders: -     Fluticasone-Salmeterol; Inhale 2 puffs into the lungs 2 (two) times daily.  Dispense: 12 g; Refill: 0 -     Benzonatate; Take 1 capsule (200 mg total) by mouth 3 (three) times daily as needed for cough.  Dispense: 30 capsule; Refill: 0 -     Pseudoeph-Bromphen-DM; Take 5-10 mLs by mouth every 6 (six) hours as needed.  Dispense: 120 mL; Refill: 0    Return if symptoms worsen or fail to improve.   Bethanie Dicker, NP-C [facility] - [facility]

## 2023-03-08 NOTE — Assessment & Plan Note (Signed)
 Symptoms began a week ago with chest and head congestion, cough, and postnasal drip, but no fever or body aches. Improvement is noted with azithromycin and albuterol inhaler. Continue azithromycin for two more days and use the albuterol inhaler as needed, up to 2 puffs every 4-6 hours. Add Advair inhaler for additional bronchodilation. Prescribe Bromfed DM and Tessalon for cough. Counseled on common side effects such as drowsiness. Advised adequate hydration. If symptoms do not continue to improve or worsen, contact the office. Seek immediate medical attention if extreme shortness of breath or difficulty breathing.

## 2023-04-04 ENCOUNTER — Encounter: Payer: Medicare Other | Admitting: Family Medicine

## 2023-04-04 ENCOUNTER — Other Ambulatory Visit: Payer: Self-pay | Admitting: Nurse Practitioner

## 2023-04-04 DIAGNOSIS — J069 Acute upper respiratory infection, unspecified: Secondary | ICD-10-CM

## 2023-04-05 ENCOUNTER — Encounter: Payer: Self-pay | Admitting: Nurse Practitioner

## 2023-04-05 ENCOUNTER — Ambulatory Visit (INDEPENDENT_AMBULATORY_CARE_PROVIDER_SITE_OTHER): Payer: Medicare Other | Admitting: Nurse Practitioner

## 2023-04-05 VITALS — BP 110/72 | HR 102 | Temp 98.2°F | Ht 65.0 in | Wt 144.2 lb

## 2023-04-05 DIAGNOSIS — Z1231 Encounter for screening mammogram for malignant neoplasm of breast: Secondary | ICD-10-CM | POA: Diagnosis not present

## 2023-04-05 DIAGNOSIS — R519 Headache, unspecified: Secondary | ICD-10-CM | POA: Diagnosis not present

## 2023-04-05 DIAGNOSIS — Z0001 Encounter for general adult medical examination with abnormal findings: Secondary | ICD-10-CM

## 2023-04-05 DIAGNOSIS — L989 Disorder of the skin and subcutaneous tissue, unspecified: Secondary | ICD-10-CM | POA: Diagnosis not present

## 2023-04-05 DIAGNOSIS — E039 Hypothyroidism, unspecified: Secondary | ICD-10-CM | POA: Diagnosis not present

## 2023-04-05 DIAGNOSIS — E7800 Pure hypercholesterolemia, unspecified: Secondary | ICD-10-CM

## 2023-04-05 DIAGNOSIS — Z1211 Encounter for screening for malignant neoplasm of colon: Secondary | ICD-10-CM | POA: Diagnosis not present

## 2023-04-05 NOTE — Progress Notes (Unsigned)
 Meghan Dicker, NP-C Phone: [phone]  Meghan Higgins is a 68 y.o. female who presents today for transfer of care and annual exam.   Discussed the use of AI scribe software for clinical note transcription with the patient, who gave verbal consent to proceed.  History of Present Illness   Meghan Higgins is a 68 year old female with hypothyroidism who presents for transfer of care and annual exam.  She experiences persistent sinus headaches, often waking up with them. The headaches are described as constant and are not relieved by Allegra or Flonase. She notes a pattern of worsening headaches with approaching snow in the winter, lasting for three days until the cold front arrives. She has not undergone a CT scan of her sinuses or consulted an ENT specialist for this issue.  She has a history of hypothyroidism and takes levothyroxine daily. No nose, skin, or temperature changes are reported, except feeling hot in the summer. No heart palpitations are noted.  She has a history of elevated LDL cholesterol but is not currently on a statin. She follows a healthy diet, mostly cooking at home, and avoids fried and fast foods. She consumes mostly chicken and vegetables, with salads in the summer, and does not drink soda. She snacks on crackers and cheese.  She reports a skin lesion on her hip that she suspects might be a mole. It appeared a couple of months ago, which she picks at in her sleep, causing it to bleed.  No chest pain, shortness of breath, abdominal pain, constipation, diarrhea, urinary symptoms, or abnormal discharge. No chronic cough, dizziness, or trouble swallowing. No mood issues, anxiety, or depression, attributing any mood changes to her dogs and her 68 year old mother, whom she cares for. She sleeps well, except for disturbances from her dogs. She does not smoke, drink alcohol, or use drugs. She is active, especially in warm weather, and plans to engage in gardening activities soon.       Social History   Tobacco Use  Smoking Status Former   Current packs/day: 0.00   Types: Cigarettes   Quit date: 12/01/2013   Years since quitting: 9.3  Smokeless Tobacco Never    Current Outpatient Medications on File Prior to Visit  Medication Sig Dispense Refill   albuterol (VENTOLIN HFA) 108 (90 Base) MCG/ACT inhaler Inhale 1-2 puffs into the lungs every 6 (six) hours as needed. 18 g 0   fexofenadine (ALLEGRA) 180 MG tablet Take 180 mg by mouth daily.     fluticasone (FLONASE) 50 MCG/ACT nasal spray Place into both nostrils daily.     fluticasone-salmeterol (ADVAIR HFA) 115-21 MCG/ACT inhaler Inhale 2 puffs into the lungs 2 (two) times daily. 12 g 0   levothyroxine (SYNTHROID) 88 MCG tablet TAKE 1 TABLET BY MOUTH DAILY WITH BREAKFAST 90 tablet 1   Multiple Vitamin (MULTIVITAMIN) tablet Take 1 tablet by mouth daily.     No current facility-administered medications on file prior to visit.     ROS see history of present illness  Objective  Physical Exam Vitals:   04/05/23 0851  BP: 110/72  Pulse: (!) 102  Temp: 98.2 F (36.8 C)  SpO2: 94%    BP Readings from Last 3 Encounters:  04/05/23 110/72  03/08/23 116/72  03/06/23 125/85   Wt Readings from Last 3 Encounters:  04/05/23 144 lb 3.2 oz (65.4 kg)  03/08/23 144 lb 12.8 oz (65.7 kg)  12/28/22 142 lb (64.4 kg)    Physical Exam Constitutional:  General: She is not in acute distress.    Appearance: Normal appearance.  HENT:     Head: Normocephalic.     Right Ear: Tympanic membrane normal.     Left Ear: Tympanic membrane normal.     Nose: Nose normal.     Mouth/Throat:     Mouth: Mucous membranes are moist.     Pharynx: Oropharynx is clear.  Rietz:     Conjunctiva/sclera: Conjunctivae normal.     Pupils: Pupils are equal, round, and reactive to light.  Neck:     Thyroid: No thyromegaly.  Cardiovascular:     Rate and Rhythm: Normal rate and regular rhythm.     Heart sounds: Normal heart sounds.   Pulmonary:     Effort: Pulmonary effort is normal.     Breath sounds: Normal breath sounds.  Abdominal:     General: Abdomen is flat. Bowel sounds are normal.     Palpations: Abdomen is soft. There is no mass.     Tenderness: There is no abdominal tenderness.  Musculoskeletal:        General: Normal range of motion.  Lymphadenopathy:     Cervical: No cervical adenopathy.  Skin:    General: Skin is warm and dry.     Findings: Lesion present. No rash.  Neurological:     General: No focal deficit present.     Mental Status: She is alert.  Psychiatric:        Mood and Affect: Mood normal.        Behavior: Behavior normal.     Assessment/Plan: Please see individual problem list.  Encounter for routine adult health examination with abnormal findings Assessment & Plan: Physical exam complete. She will return to complete lab work after fasting. Pap no longer indicated, aged out. She is due for a mammogram and Cologuard screening and is up to date on vaccines except for tetanus. Schedule the mammogram and order Cologuard screening, advise a tetanus shot at the pharmacy, and encourage maintaining a healthy lifestyle with regular exercise. Continue routine dental and eye exams. Return to care for lab work then in one year, sooner as needed.   Orders: -     CBC with Differential/Platelet; Future -     Comprehensive metabolic panel; Future -     VITAMIN D 25 Hydroxy (Vit-D Deficiency, Fractures); Future  Skin lesion Assessment & Plan: A skin lesion, consistent with a keratosis, requires monitoring for changes and further evaluation. Refer to Dermatology for total body skin exam.   Orders: -     Ambulatory referral to Dermatology  Sinus headache Assessment & Plan: Chronic issue. Persistent headaches, likely sinus-related, remain unresponsive to Allegra and Flonase and worsen with weather changes. Order a CT of the sinuses for further evaluation. Consider referral to ENT/Allergist.  Further work up pending.   Orders: -     CT SINUS WO CONTRAST; Future  Hypothyroidism, unspecified type Assessment & Plan: Her hypothyroidism is stable on levothyroxine with no new symptoms. Continue levothyroxine 88 mcg daily. We will check TSH.   Orders: -     TSH; Future  Elevated LDL cholesterol level Assessment & Plan: Chronic issue. LDL cholesterol is elevated, and she is not on a statin. Previous dietary management was attempted. Schedule a fasting lab appointment to reassess and discuss results and potential statin therapy.   Orders: -     Lipid panel; Future  Screening mammogram for breast cancer -     3D Screening Mammogram, Left and  Right; Future  Screen for colon cancer -     Cologuard    Return for fasting lab work then in one year for annual exam, sooner as needed.   Meghan Dicker, NP-C [facility] - [facility]

## 2023-04-06 ENCOUNTER — Other Ambulatory Visit (INDEPENDENT_AMBULATORY_CARE_PROVIDER_SITE_OTHER)

## 2023-04-06 ENCOUNTER — Encounter: Payer: Self-pay | Admitting: Nurse Practitioner

## 2023-04-06 DIAGNOSIS — Z0001 Encounter for general adult medical examination with abnormal findings: Secondary | ICD-10-CM | POA: Diagnosis not present

## 2023-04-06 DIAGNOSIS — E7800 Pure hypercholesterolemia, unspecified: Secondary | ICD-10-CM

## 2023-04-06 DIAGNOSIS — E039 Hypothyroidism, unspecified: Secondary | ICD-10-CM

## 2023-04-06 LAB — LIPID PANEL
Cholesterol: 204 mg/dL — ABNORMAL HIGH (ref 0–200)
HDL: 42.6 mg/dL (ref >39.00)
LDL Cholesterol: 124 mg/dL — ABNORMAL HIGH (ref 0–99)
NonHDL: 160.93
Total CHOL/HDL Ratio: 5
Triglycerides: 183 mg/dL — ABNORMAL HIGH (ref 0.0–149.0)
VLDL: 36.6 mg/dL (ref 0.0–40.0)

## 2023-04-06 LAB — CBC WITH DIFFERENTIAL/PLATELET
Basophils Absolute: 0 10*3/uL (ref 0.0–0.1)
Basophils Relative: 0.8 % (ref 0.0–3.0)
Eosinophils Absolute: 0.2 10*3/uL (ref 0.0–0.7)
Eosinophils Relative: 3.5 % (ref 0.0–5.0)
HCT: 43.5 % (ref 36.0–46.0)
Hemoglobin: 14.2 g/dL (ref 12.0–15.0)
Lymphocytes Relative: 34.6 % (ref 12.0–46.0)
Lymphs Abs: 2.2 10*3/uL (ref 0.7–4.0)
MCHC: 32.7 g/dL (ref 30.0–36.0)
MCV: 84.8 fl (ref 78.0–100.0)
Monocytes Absolute: 0.5 10*3/uL (ref 0.1–1.0)
Monocytes Relative: 8 % (ref 3.0–12.0)
Neutro Abs: 3.4 10*3/uL (ref 1.4–7.7)
Neutrophils Relative %: 53.1 % (ref 43.0–77.0)
Platelets: 203 10*3/uL (ref 150.0–400.0)
RBC: 5.13 Mil/uL — ABNORMAL HIGH (ref 3.87–5.11)
RDW: 15.2 % (ref 11.5–15.5)
WBC: 6.4 10*3/uL (ref 4.0–10.5)

## 2023-04-06 LAB — COMPREHENSIVE METABOLIC PANEL WITH GFR
ALT: 21 U/L (ref 0–35)
AST: 20 U/L (ref 0–37)
Albumin: 4.3 g/dL (ref 3.5–5.2)
Alkaline Phosphatase: 65 U/L (ref 39–117)
BUN: 19 mg/dL (ref 6–23)
CO2: 29 meq/L (ref 19–32)
Calcium: 9.5 mg/dL (ref 8.4–10.5)
Chloride: 103 meq/L (ref 96–112)
Creatinine, Ser: 0.84 mg/dL (ref 0.40–1.20)
GFR: 71.65 mL/min (ref >60.00)
Glucose, Bld: 86 mg/dL (ref 70–99)
Potassium: 4.3 meq/L (ref 3.5–5.1)
Sodium: 139 meq/L (ref 135–145)
Total Bilirubin: 0.4 mg/dL (ref 0.2–1.2)
Total Protein: 6.8 g/dL (ref 6.0–8.3)

## 2023-04-06 NOTE — Assessment & Plan Note (Signed)
 Her hypothyroidism is stable on levothyroxine with no new symptoms. Continue levothyroxine 88 mcg daily. We will check TSH.

## 2023-04-06 NOTE — Assessment & Plan Note (Signed)
 Physical exam complete. She will return to complete lab work after fasting. Pap no longer indicated, aged out. She is due for a mammogram and Cologuard screening and is up to date on vaccines except for tetanus. Schedule the mammogram and order Cologuard screening, advise a tetanus shot at the pharmacy, and encourage maintaining a healthy lifestyle with regular exercise. Continue routine dental and eye exams. Return to care for lab work then in one year, sooner as needed.

## 2023-04-06 NOTE — Assessment & Plan Note (Signed)
 A skin lesion, consistent with a keratosis, requires monitoring for changes and further evaluation. Refer to Dermatology for total body skin exam.

## 2023-04-06 NOTE — Assessment & Plan Note (Signed)
 Chronic issue. Persistent headaches, likely sinus-related, remain unresponsive to Allegra and Flonase and worsen with weather changes. Order a CT of the sinuses for further evaluation. Consider referral to ENT/Allergist. Further work up pending.

## 2023-04-06 NOTE — Assessment & Plan Note (Signed)
 Chronic issue. LDL cholesterol is elevated, and she is not on a statin. Previous dietary management was attempted. Schedule a fasting lab appointment to reassess and discuss results and potential statin therapy.

## 2023-04-07 ENCOUNTER — Other Ambulatory Visit: Payer: Self-pay | Admitting: Nurse Practitioner

## 2023-04-07 DIAGNOSIS — J069 Acute upper respiratory infection, unspecified: Secondary | ICD-10-CM

## 2023-04-07 LAB — VITAMIN D 25 HYDROXY (VIT D DEFICIENCY, FRACTURES): VITD: 43.53 ng/mL (ref 30.00–100.00)

## 2023-04-07 LAB — TSH: TSH: 1.94 u[IU]/mL (ref 0.35–5.50)

## 2023-04-08 ENCOUNTER — Encounter: Payer: Self-pay | Admitting: Nurse Practitioner

## 2023-04-14 ENCOUNTER — Ambulatory Visit
Admission: RE | Admit: 2023-04-14 | Discharge: 2023-04-14 | Disposition: A | Discharge status: Home / Self Care | Source: Ambulatory Visit | Attending: Nurse Practitioner | Admitting: Nurse Practitioner

## 2023-04-14 DIAGNOSIS — J342 Deviated nasal septum: Secondary | ICD-10-CM | POA: Diagnosis not present

## 2023-04-14 DIAGNOSIS — J3489 Other specified disorders of nose and nasal sinuses: Secondary | ICD-10-CM | POA: Diagnosis not present

## 2023-04-14 DIAGNOSIS — Z1211 Encounter for screening for malignant neoplasm of colon: Secondary | ICD-10-CM | POA: Diagnosis not present

## 2023-04-14 DIAGNOSIS — R519 Headache, unspecified: Secondary | ICD-10-CM | POA: Insufficient documentation

## 2023-04-14 DIAGNOSIS — J329 Chronic sinusitis, unspecified: Secondary | ICD-10-CM | POA: Diagnosis not present

## 2023-04-21 LAB — COLOGUARD: COLOGUARD: NEGATIVE

## 2023-04-22 ENCOUNTER — Encounter: Payer: Self-pay | Admitting: Nurse Practitioner

## 2023-05-03 ENCOUNTER — Telehealth: Payer: Self-pay

## 2023-05-03 NOTE — Telephone Encounter (Signed)
 Copied from CRM [id number]. Topic: Clinical - Lab/Test Results >> May 03, 2023  1:03 PM Meghan Higgins wrote: Reason for CRM: Patient is returning a callback regarding the results of their CT sinus scan.    [phone] Callback Number

## 2023-05-03 NOTE — Telephone Encounter (Signed)
 Copied from CRM [id number]. Topic: Clinical - Lab/Test Results >> May 03, 2023  1:03 PM Caliyah H wrote: Reason for CRM: Patient is returning a callback regarding the results of their CT sinus scan.    [phone] Callback Number

## 2023-07-04 ENCOUNTER — Other Ambulatory Visit: Payer: Self-pay | Admitting: Nurse Practitioner

## 2023-07-04 DIAGNOSIS — E039 Hypothyroidism, unspecified: Secondary | ICD-10-CM

## 2023-07-04 MED ORDER — LEVOTHYROXINE SODIUM 88 MCG PO TABS: 88.0000 ug | ORAL_TABLET | Freq: Every day | ORAL | 3 refills | Status: AC

## 2023-07-04 NOTE — Telephone Encounter (Signed)
 Copied from CRM [id number]. Topic: Clinical - Medication Refill >> Jul 04, 2023  1:12 PM [place] J wrote: Medication: levothyroxine  (SYNTHROID ) 88 MCG tablet  Has the patient contacted their pharmacy? Yes (Agent: If no, request that the patient contact the pharmacy for the refill. If patient does not wish to contact the pharmacy document the reason why and proceed with request.) (Agent: If yes, when and what did the pharmacy advise?) Pharmacy tried to send out a request on their end but we never received it.  This is the patient's preferred pharmacy:  [facility] [id number] [place], [place] - [address] [place] [address] Phone: [phone] Fax: [phone]  Is this the correct pharmacy for this prescription? Yes If no, delete pharmacy and type the correct one.   Has the prescription been filled recently? No  Is the patient out of the medication? Yes  Has the patient been seen for an appointment in the last year OR does the patient have an upcoming appointment? Yes  Can we respond through MyChart? Yes  Agent: Please be advised that Rx refills may take up to 3 business days. We ask that you follow-up with your pharmacy.

## 2023-07-08 ENCOUNTER — Other Ambulatory Visit: Payer: Self-pay | Admitting: Nurse Practitioner

## 2023-07-08 DIAGNOSIS — N63 Unspecified lump in unspecified breast: Secondary | ICD-10-CM

## 2023-07-08 DIAGNOSIS — Z1231 Encounter for screening mammogram for malignant neoplasm of breast: Secondary | ICD-10-CM

## 2023-08-03 ENCOUNTER — Ambulatory Visit
Admission: RE | Admit: 2023-08-03 | Discharge: 2023-08-03 | Disposition: A | Discharge status: Home / Self Care | Source: Ambulatory Visit | Attending: Nurse Practitioner | Admitting: Nurse Practitioner

## 2023-08-03 DIAGNOSIS — N63 Unspecified lump in unspecified breast: Secondary | ICD-10-CM | POA: Diagnosis present

## 2023-08-03 DIAGNOSIS — Z1231 Encounter for screening mammogram for malignant neoplasm of breast: Secondary | ICD-10-CM

## 2023-08-03 DIAGNOSIS — N6323 Unspecified lump in the left breast, lower outer quadrant: Secondary | ICD-10-CM | POA: Insufficient documentation

## 2023-08-03 DIAGNOSIS — R928 Other abnormal and inconclusive findings on diagnostic imaging of breast: Secondary | ICD-10-CM | POA: Diagnosis not present

## 2023-08-03 DIAGNOSIS — N6320 Unspecified lump in the left breast, unspecified quadrant: Secondary | ICD-10-CM | POA: Diagnosis not present

## 2023-08-03 DIAGNOSIS — R92323 Mammographic fibroglandular density, bilateral breasts: Secondary | ICD-10-CM | POA: Diagnosis not present

## 2023-08-14 ENCOUNTER — Emergency Department
Admission: EM | Admit: 2023-08-14 | Discharge: 2023-08-14 | Disposition: A | Discharge status: Home / Self Care | Attending: Emergency Medicine | Admitting: Emergency Medicine

## 2023-08-14 ENCOUNTER — Other Ambulatory Visit: Payer: Self-pay

## 2023-08-14 ENCOUNTER — Emergency Department

## 2023-08-14 DIAGNOSIS — R0789 Other chest pain: Secondary | ICD-10-CM | POA: Diagnosis not present

## 2023-08-14 DIAGNOSIS — E039 Hypothyroidism, unspecified: Secondary | ICD-10-CM | POA: Diagnosis not present

## 2023-08-14 DIAGNOSIS — R079 Chest pain, unspecified: Secondary | ICD-10-CM | POA: Diagnosis not present

## 2023-08-14 DIAGNOSIS — K219 Gastro-esophageal reflux disease without esophagitis: Secondary | ICD-10-CM | POA: Diagnosis not present

## 2023-08-14 LAB — CBC
HCT: 41.7 % (ref 36.0–46.0)
Hemoglobin: 13.6 g/dL (ref 12.0–15.0)
MCH: 28.1 pg (ref 26.0–34.0)
MCHC: 32.6 g/dL (ref 30.0–36.0)
MCV: 86.2 fL (ref 80.0–100.0)
Platelets: 219 K/uL (ref 150–400)
RBC: 4.84 MIL/uL (ref 3.87–5.11)
RDW: 13 % (ref 11.5–15.5)
WBC: 8.2 K/uL (ref 4.0–10.5)
nRBC: 0 % (ref 0.0–0.2)

## 2023-08-14 LAB — BASIC METABOLIC PANEL WITH GFR
Anion gap: 10 (ref 5–15)
BUN: 20 mg/dL (ref 8–23)
CO2: 23 mmol/L (ref 22–32)
Calcium: 9.4 mg/dL (ref 8.9–10.3)
Chloride: 105 mmol/L (ref 98–111)
Creatinine, Ser: 0.8 mg/dL (ref 0.44–1.00)
GFR, Estimated: >60 mL/min (ref >60)
Glucose, Bld: 103 mg/dL — ABNORMAL HIGH (ref 70–99)
Potassium: 3.7 mmol/L (ref 3.5–5.1)
Sodium: 138 mmol/L (ref 135–145)

## 2023-08-14 LAB — TROPONIN I (HIGH SENSITIVITY): Troponin I (High Sensitivity): 4 ng/L (ref <18)

## 2023-08-14 NOTE — ED Triage Notes (Signed)
 Pt mentions she is coming in for chest pain that has been ongoing since yesterday. She states that has a Hx of Acid reflux but never to the extent she is having it now. She has tried multiple OTC medications at home for pain and acid reflux with no alleviation in pain. Pt does mention recent life stressors with her own mother and her health. She is otherwise stable with no significant heart Hx that she knows of.

## 2023-08-14 NOTE — ED Provider Notes (Signed)
 [facility] Provider Note    Event Date/Time   First MD Initiated Contact with Patient 08/14/23 2054     (approximate)   History   Chief Complaint: Chest Pain   HPI  Meghan Higgins is a 68 y.o. female with a history of GERD, hypothyroidism who comes ED complaining of chest pain since yesterday.  Central, feels like heartburn.  Not exertional, not pleuritic.  No associated shortness of breath diaphoresis vomiting or radiation.  Has specifically been able to do strenuous activity during this time without aggravation of the pain.  Initially was relieved with Pepto-Bismol, but then worsened through the night and has been more unremitting today.        Past Medical History:  Diagnosis Date   Allergy    Arthritis    Frequent headaches    Thyroid  disease     Current Outpatient Rx   Order #: [id number] Class: Normal   Order #: [id number] Class: Historical Med   Order #: [id number] Class: Historical Med   Order #: [id number] Class: Normal   Order #: [id number] Class: Normal   Order #: [id number] Class: Historical Med    Past Surgical History:  Procedure Laterality Date   APPENDECTOMY  01/11/1965   BREAST BIOPSY Left 08/17/2018   Venus clip - PAPILLARY APOCRINE METAPLASIA AND FIBROCYSTIC CHANGES. - NEGATIVE   BREAST BIOPSY Right     Physical Exam   Triage Vital Signs: ED Triage Vitals  Encounter Vitals Group     BP 08/14/23 2023 (!) 159/85     Girls Systolic BP Percentile --      Girls Diastolic BP Percentile --      Boys Systolic BP Percentile --      Boys Diastolic BP Percentile --      Pulse Rate 08/14/23 2023 86     Resp 08/14/23 2023 17     Temp 08/14/23 2023 98.1 F (36.7 C)     Temp src --      SpO2 08/14/23 2023 98 %     Weight --      Height --      Head Circumference --      Peak Flow --      Pain Score 08/14/23 2022 10     Pain Loc --      Pain Education --      Exclude from Growth Chart --     Most recent vital signs: Vitals:    08/14/23 2023  BP: (!) 159/85  Pulse: 86  Resp: 17  Temp: 98.1 F (36.7 C)  SpO2: 98%    General: Awake, no distress.  CV:  Good peripheral perfusion.  Regular rate rhythm Resp:  Normal effort.  Clear to auscultation Abd:  No distention.  Soft nontender Other:  No lower extremity edema, no calf tenderness.  Moist oral mucosa   ED Results / Procedures / Treatments   Labs (all labs ordered are listed, but only abnormal results are displayed) Labs Reviewed  BASIC METABOLIC PANEL WITH GFR - Abnormal; Notable for the following components:      Result Value   Glucose, Bld 103 (*)    All other components within normal limits  CBC  TROPONIN I (HIGH SENSITIVITY)  TROPONIN I (HIGH SENSITIVITY)     EKG Interpreted by me Sinus rhythm rate of 90.  Normal axis intervals QRS ST segments and T waves   RADIOLOGY Chest x-ray interpreted by me, unremarkable.  Radiology report reviewed   PROCEDURES:  Procedures  MEDICATIONS ORDERED IN ED: Medications - No data to display   IMPRESSION / MDM / ASSESSMENT AND PLAN / ED COURSE  I reviewed the triage vital signs and the nursing notes.  DDx: GERD, non-STEMI, pneumothorax, pneumonia.  Doubt cholecystitis, pancreatitis, surgical abdomen, PE, dissection  Patient's presentation is most consistent with acute presentation with potential threat to life or bodily function.  Patient presents with atypical chest pain, consistent with GERD.  Reassuring exam, EKG, chest x-ray, labs.  High-sensitivity troponin normal after 24 hours of symptoms, no further workup indicated.  Recommend continuing twice daily Pepcid over the next week and following up with PCP which patient is agreeable.       FINAL CLINICAL IMPRESSION(S) / ED DIAGNOSES   Final diagnoses:  Gastroesophageal reflux disease without esophagitis     Rx / DC Orders   ED Discharge Orders     None        Note:  This document was prepared using Dragon voice recognition  software and may include unintentional dictation errors.   Viviann Pastor, MD 08/14/23 [phone]

## 2023-08-14 NOTE — ED Notes (Signed)
 Discharged home in good condition, papers provided and reviewed, ambulatory by self to lobby

## 2023-10-10 DIAGNOSIS — H2513 Age-related nuclear cataract, bilateral: Secondary | ICD-10-CM | POA: Diagnosis not present

## 2024-01-26 ENCOUNTER — Ambulatory Visit: Payer: Self-pay

## 2024-01-26 ENCOUNTER — Ambulatory Visit
Admission: EM | Admit: 2024-01-26 | Discharge: 2024-01-26 | Disposition: A | Discharge status: Home / Self Care | Attending: Emergency Medicine | Admitting: Emergency Medicine

## 2024-01-26 DIAGNOSIS — R079 Chest pain, unspecified: Secondary | ICD-10-CM

## 2024-01-26 DIAGNOSIS — R9431 Abnormal electrocardiogram [ECG] [EKG]: Secondary | ICD-10-CM | POA: Diagnosis not present

## 2024-01-26 MED ORDER — ALUM & MAG HYDROXIDE-SIMETH 200-200-20 MG/5ML PO SUSP: 30.0000 mL | Freq: Once | ORAL | Status: DC

## 2024-01-26 MED ORDER — LIDOCAINE VISCOUS HCL 2 % MT SOLN: 15.0000 mL | Freq: Once | OROMUCOSAL | Status: DC

## 2024-01-26 NOTE — ED Triage Notes (Signed)
 Pt states she is having heartburn that comes and goes x 2 days .taking Pepto bismol, Pepcid with no relief of symptoms.

## 2024-01-26 NOTE — ED Notes (Signed)
 Patient is being discharged from the Urgent Care and sent to the Emergency Department via PV . Per provider kelly T., patient is in need of higher level of care due to heartburn with chest pain. Patient is aware and verbalizes understanding of plan of care.  Vitals:   01/26/24 0946  BP: 132/76  Pulse: 66  Resp: 16  Temp: 97.9 F (36.6 C)  SpO2: 94%

## 2024-01-26 NOTE — Telephone Encounter (Signed)
 Per pt chart pt has been seen and at [facility] on today

## 2024-01-26 NOTE — Telephone Encounter (Signed)
 FYI Only or Action Required?: FYI only for provider: Advised UC.  Patient was last seen in primary care on 04/05/2023 by Meghan App, NP.  Called Nurse Triage reporting Chest Pain.  Symptoms began yesterday.  Interventions attempted: OTC medications: Pepto Bismol, Pepcid.  Symptoms are: unchanged.  Triage Disposition: See Physician Within 24 Hours  Patient/caregiver understands and will follow disposition?: Yes   Reason for Disposition  [1] Chest pain lasts < 5 minutes AND [2] NO chest pain or cardiac symptoms (e.g., breathing difficulty, sweating) now  (Exception: Chest pains that last only a few seconds.)  Answer Assessment - Initial Assessment Questions No available appointments, advised UC today, patient agreeable.   1. LOCATION: Where does it hurt?       Chest, under breasts upper abd   2. RADIATION: Does the pain go anywhere else? (e.g., into neck, jaw, arms, back)     Mid back across bra line  3. ONSET: When did the chest pain begin? (Minutes, hours or days)      Yesterday morning  4. PATTERN: Does the pain come and go, or has it been constant since it started?  Does it get worse with exertion?      Comes and goes   5. DURATION: How long does it last (e.g., seconds, minutes, hours)     About 5 minutes  6. SEVERITY: How bad is the pain?  (e.g., Scale 1-10; mild, moderate, or severe)     7/10  7. CARDIAC RISK FACTORS: Do you have any history of heart problems or risk factors for heart disease? (e.g., angina, prior heart attack; diabetes, high blood pressure, high cholesterol, smoker, or strong family history of heart disease)     Denies  8. PULMONARY RISK FACTORS: Do you have any history of lung disease?  (e.g., blood clots in lung, asthma, emphysema, birth control pills)     Denies  9. CAUSE: What do you think is causing the chest pain?     GERD  10. OTHER SYMPTOMS: Do you have any other symptoms? (e.g., dizziness, nausea, vomiting,  sweating, fever, difficulty breathing, cough)       Nausea, burping  Protocols used: Chest Pain-A-AH  Copied from CRM #[id number]. Topic: Clinical - Red Word Triage >> Jan 26, 2024  8:55 AM Vena HERO wrote: Red Word that prompted transfer to Nurse Triage: severe heartburn with nausea and chest pain, started yesterday morning

## 2024-01-26 NOTE — Discharge Instructions (Signed)
Go to the emergency department for evaluation of your chest pain.   

## 2024-01-26 NOTE — ED Provider Notes (Signed)
 " Meghan Higgins    CSN: [id number] Arrival date & time: 01/26/24  9070      History   Chief Complaint Chief Complaint  Patient presents with   Heartburn    HPI Meghan Higgins is a 69 y.o. female.  Patient presents with intermittent pain in her lower sternum which radiates through to her back x 2 days.  She believes this to be GERD related.  No pain currently.  The pain occurs usually when she lies down.  She took Pepcid twice yesterday and Tums during the night.  She took Pepcid again this morning.  During the episodes of reflux-like pain, she has associated nausea but no vomiting.  She is currently asymptomatic; No chest pain, shortness of breath, dizziness, weakness, numbness, vision change, nausea, vomiting, diarrhea, constipation, abdominal pain, fever.  Patient contacted her PCPs office today reporting that she had chest pain and was instructed to come to urgent care.  Patient was seen at [facility] ED on 08/14/2023; diagnosed with GERD; workup was negative and she was discharged with instructions to take Pepcid and follow-up with her PCP.    The history is provided by the patient and medical records.    Past Medical History:  Diagnosis Date   Allergy    Arthritis    Frequent headaches    Thyroid  disease     Patient Active Problem List   Diagnosis Date Noted   Skin lesion 04/05/2023   Upper respiratory tract infection 03/08/2023   Community acquired pneumonia 09/24/2022   Chronic neck pain 03/24/2021   Elevated LDL cholesterol level 07/21/2018   Abnormal mammogram 02/24/2017   Allergic rhinitis 02/24/2017   Encounter for routine adult health examination with abnormal findings 01/01/2016   Hypothyroidism 12/02/2014   Sinus headache 12/02/2014    Past Surgical History:  Procedure Laterality Date   APPENDECTOMY  01/11/1965   BREAST BIOPSY Left 08/17/2018   Venus clip - PAPILLARY APOCRINE METAPLASIA AND FIBROCYSTIC CHANGES. - NEGATIVE   BREAST BIOPSY Right     OB  History   No obstetric history on file.      Home Medications    Prior to Admission medications  Medication Sig Start Date End Date Taking? Authorizing Provider  levothyroxine  (SYNTHROID ) 88 MCG tablet Take 1 tablet (88 mcg total) by mouth daily with breakfast. 07/04/23  Yes Gretel App, NP  albuterol  (VENTOLIN  HFA) 108 (90 Base) MCG/ACT inhaler Inhale 1-2 puffs into the lungs every 6 (six) hours as needed. 03/06/23   Corlis Burnard DEL, NP  fexofenadine (ALLEGRA) 180 MG tablet Take 180 mg by mouth daily.    [provider]  fluticasone  (FLONASE) 50 MCG/ACT nasal spray Place into both nostrils daily.    [provider]  fluticasone -salmeterol (ADVAIR HFA) 115-21 MCG/ACT inhaler Inhale 2 puffs into the lungs 2 (two) times daily. 03/08/23   Gretel App, NP  Multiple Vitamin (MULTIVITAMIN) tablet Take 1 tablet by mouth daily.    [provider]    Family History Family History  Problem Relation Age of Onset   Cancer Father    Heart disease Father    Stroke Father    Diabetes Father    Diabetes Sister    Cancer Sister    Cancer Maternal Grandmother    Breast cancer Maternal Grandmother     Social History Social History[1]   Allergies   Codeine, Prednisone, and Augmentin  [amoxicillin -pot clavulanate]   Review of Systems Review of Systems  Constitutional:  Negative for chills and  fever.  Balderrama:  Negative for visual disturbance.  Respiratory:  Negative for cough and shortness of breath.   Cardiovascular:  Positive for chest pain. Negative for palpitations.  Gastrointestinal:  Positive for nausea. Negative for abdominal pain, constipation, diarrhea and vomiting.  Neurological:  Negative for dizziness, syncope, facial asymmetry, speech difficulty, weakness, light-headedness, numbness and headaches.     Physical Exam Triage Vital Signs ED Triage Vitals  Encounter Vitals Group     BP 01/26/24 0946 132/76     Girls Systolic BP Percentile --      Girls  Diastolic BP Percentile --      Boys Systolic BP Percentile --      Boys Diastolic BP Percentile --      Pulse Rate 01/26/24 0946 66     Resp 01/26/24 0946 16     Temp 01/26/24 0946 97.9 F (36.6 C)     Temp Source 01/26/24 0946 Oral     SpO2 01/26/24 0946 94 %     Weight --      Height --      Head Circumference --      Peak Flow --      Pain Score 01/26/24 0943 0     Pain Loc --      Pain Education --      Exclude from Growth Chart --    No data found.  Updated Vital Signs BP 132/76 (BP Location: Right Arm)   Pulse 66   Temp 97.9 F (36.6 C) (Oral)   Resp 16   SpO2 94%   Visual Acuity Right Eye Distance:   Left Eye Distance:   Bilateral Distance:    Right Eye Near:   Left Eye Near:    Bilateral Near:     Physical Exam Constitutional:      General: She is not in acute distress. HENT:     Mouth/Throat:     Mouth: Mucous membranes are moist.  Cardiovascular:     Rate and Rhythm: Normal rate and regular rhythm.     Heart sounds: Normal heart sounds.  Pulmonary:     Effort: Pulmonary effort is normal. No respiratory distress.     Breath sounds: Normal breath sounds.  Abdominal:     General: Bowel sounds are normal.     Palpations: Abdomen is soft.     Tenderness: There is no abdominal tenderness. There is no guarding or rebound.  Musculoskeletal:     Right lower leg: No edema.     Left lower leg: No edema.  Neurological:     General: No focal deficit present.     Mental Status: She is alert.     Sensory: No sensory deficit.     Motor: No weakness.     Gait: Gait normal.      UC Treatments / Results  Labs (all labs ordered are listed, but only abnormal results are displayed) Labs Reviewed - No data to display  EKG   Radiology No results found.  Procedures Procedures (including critical care time)  Medications Ordered in UC Medications - No data to display  Initial Impression / Assessment and Plan / UC Course  I have reviewed the triage  vital signs and the nursing notes.  Pertinent labs & imaging results that were available during my care of the patient were reviewed by me and considered in my medical decision making (see chart for details).   Chest pain, abnormal EKG.  Afebrile and vital signs are stable.  Discussed limitations of evaluation of her symptoms in an urgent care setting.  Patient initially declined transfer to the ED. EKG shows sinus rhythm, rate 71, no ST elevation, T wave change in V3 when compared to previous from 08/14/2023; abnormal EKG.  Discussed EKG change noted when compared to previous from August.  Sending patient to the ED for evaluation.  She is agreeable to this and will go to [facility] ED now.  Final Clinical Impressions(s) / UC Diagnoses   Final diagnoses:  Chest pain, unspecified type  Abnormal EKG     Discharge Instructions      Go to the emergency department for evaluation of your chest pain.     ED Prescriptions   None    PDMP not reviewed this encounter.    [1]  Social History Tobacco Use   Smoking status: Former    Current packs/day: 0.00    Types: Cigarettes    Quit date: 12/01/2013    Years since quitting: 10.1   Smokeless tobacco: Never  Vaping Use   Vaping status: Never Used  Substance Use Topics   Alcohol use: No    Alcohol/week: 0.0 standard drinks of alcohol   Drug use: No     Corlis Burnard DEL, NP 01/26/24 1024  "

## 2024-02-01 ENCOUNTER — Encounter: Payer: Self-pay | Admitting: Cardiology

## 2024-02-01 ENCOUNTER — Ambulatory Visit: Attending: Cardiology | Admitting: Cardiology

## 2024-02-01 VITALS — BP 138/78 | HR 68 | Ht 65.5 in | Wt 146.0 lb

## 2024-02-01 DIAGNOSIS — E782 Mixed hyperlipidemia: Secondary | ICD-10-CM | POA: Diagnosis not present

## 2024-02-01 DIAGNOSIS — R072 Precordial pain: Secondary | ICD-10-CM

## 2024-02-01 DIAGNOSIS — R9431 Abnormal electrocardiogram [ECG] [EKG]: Secondary | ICD-10-CM

## 2024-02-01 MED ORDER — METOPROLOL TARTRATE 50 MG PO TABS: ORAL_TABLET | ORAL | 0 refills | Status: AC

## 2024-02-01 NOTE — Progress Notes (Signed)
 " Cardiology Office Note:    Date:  02/01/2024   ID:  Meghan Higgins, DOB September 01, 1955, MRN [id number]  PCP:  Gretel App, NP   [facility] Providers Cardiologist:  None     Referring MD: Gretel App, NP   Chief Complaint  Patient presents with   New Patient (Initial Visit)    New pt has been doing well with no complaints of chest pain, chest pressure or SOB, medicaion reviewed verbally with patient Self referral, abnormal EKG     History of Present Illness:    Meghan Higgins is a 69 y.o. female with a hx of hypothyroidism, former smoker x 30 years, presenting due to chest pain and abnormal ECG.  Patient endorses chest pain 4 months ago while at home.  Pain was substernal radiating to her back.  Presented to the ED where workup was unrevealing.  Diagnosed with possible GI upset, given Pepcid.  Has been doing well until last week when she had another episode of chest pain similar to 4 months before.  Symptoms lasted the whole day, presented to urgent care negative.  Workup was unrevealing, EKG obtained showed nonspecific T wave abnormalities, advised to go to the ED but patient told she will be told the same thing as before which was GI upset.  She took Tums, Pepcid with improvement in symptoms after a day or so.  Has been doing okay since.  Father had CAD/CABG in his 11s.  Past Medical History:  Diagnosis Date   Allergy    Arthritis    Frequent headaches    Thyroid  disease     Past Surgical History:  Procedure Laterality Date   APPENDECTOMY  01/11/1965   BREAST BIOPSY Left 08/17/2018   Venus clip - PAPILLARY APOCRINE METAPLASIA AND FIBROCYSTIC CHANGES. - NEGATIVE   BREAST BIOPSY Right     Current Medications: Active Medications[1]   Allergies:   Codeine, Prednisone, and Augmentin  [amoxicillin -pot clavulanate]   Social History   Socioeconomic History   Marital status: Single    Spouse name: Not on file   Number of children: Not on file   Years of education: Not  on file   Highest education level: Some college, no degree  Occupational History   Not on file  Tobacco Use   Smoking status: Former    Current packs/day: 0.00    Types: Cigarettes    Quit date: 12/01/2013    Years since quitting: 10.1   Smokeless tobacco: Never  Vaping Use   Vaping status: Never Used  Substance and Sexual Activity   Alcohol use: No    Alcohol/week: 0.0 standard drinks of alcohol   Drug use: No   Sexual activity: Never  Other Topics Concern   Not on file  Social History Narrative   Single   Employed at [employer] as a [employer]   Pets- 1 dog    Caffeine- Coffee 1 cup in the am and tea 20 oz throughout the days, no soda, uses splenda          Social Drivers of Health   Tobacco Use: Medium Risk (02/01/2024)   Patient History    Smoking Tobacco Use: Former    Smokeless Tobacco Use: Never    Passive Exposure: Not on file  Financial Resource Strain: Low Risk (12/27/2023)   Overall Financial Resource Strain (CARDIA)    Difficulty of Paying Living Expenses: Not hard at all  Food Insecurity: No Food Insecurity (12/27/2023)  Epic    Worried About Programme Researcher, Broadcasting/film/video in the Last Year: Never true    [employer] of Food in the Last Year: Never true  Transportation Needs: No Transportation Needs (12/27/2023)   Epic    Lack of Transportation (Medical): No    Lack of Transportation (Non-Medical): No  Physical Activity: Sufficiently Active (12/27/2023)   Exercise Vital Sign    Days of Exercise per Week: 7 days    Minutes of Exercise per Session: 30 min  Stress: No Stress Concern Present (12/27/2023)   [employer] of Occupational Health - Occupational Stress Questionnaire    Feeling of Stress: Not at all  Social Connections: Socially Isolated (12/27/2023)   Social Connection and Isolation Panel    Frequency of Communication with Friends and Family: Once a week    Frequency of Social Gatherings with Friends and Family: Never    Attends  Religious Services: Never    Database Administrator or Organizations: No    Attends Engineer, Structural: Not on file    Marital Status: Never married  Depression (PHQ2-9): Low Risk (04/05/2023)   Depression (PHQ2-9)    PHQ-2 Score: 0  Alcohol Screen: Low Risk (12/24/2022)   Alcohol Screen    Last Alcohol Screening Score (AUDIT): 0  Housing: Low Risk (12/27/2023)   Epic    Unable to Pay for Housing in the Last Year: No    Number of Times Moved in the Last Year: 0    Homeless in the Last Year: No  Utilities: Not At Risk (12/24/2022)   AHC Utilities    Threatened with loss of utilities: No  Health Literacy: Adequate Health Literacy (12/28/2022)   B1300 Health Literacy    Frequency of need for help with medical instructions: Never     Family History: The patient's family history includes Breast cancer in her maternal grandmother; Cancer in her father, maternal grandmother, and sister; Diabetes in her father and sister; Heart disease in her father; Hypertension in her mother; Stroke in her father.  ROS:   Please see the history of present illness.     All other systems reviewed and are negative.  EKGs/Labs/Other Studies Reviewed:    The following studies were reviewed today:  EKG Interpretation Date/Time:  Wednesday February 01 2024 08:31:06 EST Ventricular Rate:  68 PR Interval:  156 QRS Duration:  62 QT Interval:  374 QTC Calculation: 397 R Axis:   66  Text Interpretation: Normal sinus rhythm Septal infarct Confirmed by Darliss Rogue ([id number]) on 02/01/2024 8:57:45 AM    Recent Labs: 04/06/2023: ALT 21; TSH 1.94 08/14/2023: BUN 20; Creatinine, Ser 0.80; Hemoglobin 13.6; Platelets 219; Potassium 3.7; Sodium 138  Recent Lipid Panel    Component Value Date/Time   CHOL 204 (H) 04/06/2023 0747   TRIG 183.0 (H) 04/06/2023 0747   HDL 42.60 04/06/2023 0747   CHOLHDL 5 04/06/2023 0747   VLDL 36.6 04/06/2023 0747   LDLCALC 124 (H) 04/06/2023 0747   LDLDIRECT 146.0  03/29/2022 1042     Risk Assessment/Calculations:             Physical Exam:    VS:  BP 138/78 (BP Location: Left Arm, Patient Position: Sitting, Cuff Size: Normal)   Pulse 68   Ht 5' 5.5 (1.664 m)   Wt 146 lb (66.2 kg)   SpO2 98%   BMI 23.93 kg/m     Wt Readings from Last 3 Encounters:  02/01/24 146 lb (66.2 kg)  04/05/23  144 lb 3.2 oz (65.4 kg)  03/08/23 144 lb 12.8 oz (65.7 kg)     GEN:  Well nourished, well developed in no acute distress HEENT: Normal NECK: No JVD; No carotid bruits CARDIAC: RRR, no murmurs, rubs, gallops RESPIRATORY:  Clear to auscultation without rales, wheezing or rhonchi  ABDOMEN: Soft, non-tender, non-distended MUSCULOSKELETAL:  No edema; No deformity  SKIN: Warm and dry NEUROLOGIC:  Alert and oriented x 3 PSYCHIATRIC:  Normal affect   ASSESSMENT:    1. Precordial pain   2. Mixed hyperlipidemia   3. Abnormal EKG    PLAN:    In order of problems listed above:  Chest pain, several risk factors including family history, former smoker, elevated cholesterol.  Obtain echo, obtain coronary CT. Hyperlipidemia, plan to dose statin after CCTA results above. Abnormal EKG, possible old septal infarct, echo and CCTA as above.  Follow-up after cardiac testing      Medication Adjustments/Labs and Tests Ordered: Current medicines are reviewed at length with the patient today.  Concerns regarding medicines are outlined above.  Orders Placed This Encounter  Procedures   CT CORONARY MORPH W/CTA COR W/SCORE W/CA W/CM &/OR WO/CM   Basic metabolic panel with GFR   EKG 87-Ozji   ECHOCARDIOGRAM COMPLETE   Meds ordered this encounter  Medications   metoprolol  tartrate (LOPRESSOR ) 50 MG tablet    Sig: TAKE 1 TABLET 2 HR PRIOR TO CARDIAC PROCEDURE    Dispense:  1 tablet    Refill:  0    Patient Instructions  Medication Instructions:  - take 50 mg metoprolol  two hours prior to cardiac CTA  *If you need a refill on your cardiac medications  before your next appointment, please call your pharmacy*  Lab Work: Your provider would like for you to have following labs drawn today BMP.    If you have labs (blood work) drawn today and your tests are completely normal, you will receive your results only by: MyChart Message (if you have MyChart) OR A paper copy in the mail If you have any lab test that is abnormal or we need to change your treatment, we will call you to review the results.  Testing/Procedures: Your physician has requested that you have an echocardiogram. Echocardiography is a painless test that uses sound waves to create images of your heart. It provides your doctor with information about the size and shape of your heart and how well your hearts chambers and valves are working.   You may receive an ultrasound enhancing agent through an IV if needed to better visualize your heart during the echo. This procedure takes approximately one hour.  There are no restrictions for this procedure.  This will take place at [address] [facility] [address]) [address], [place] [address]  Please note: We ask at that you not bring children with you during ultrasound (echo/ vascular) testing. Due to room size and safety concerns, children are not allowed in the ultrasound rooms during exams. Our front office staff cannot provide observation of children in our lobby area while testing is being conducted. An adult accompanying a patient to their appointment will only be allowed in the ultrasound room at the discretion of the ultrasound technician under special circumstances. We apologize for any inconvenience.   Your cardiac CT will be scheduled at one of the below locations:    [facility] [address] [place], [place] [address] [phone]  OR   [facility] [address]  [place], [place] [address]  If scheduled at the Heart and Vascular Tower at [address],  please enter the parking lot using the [address] entrance and use the FREE valet service at the patient drop-off area. Enter the building and check-in with registration on the main floor.  If scheduled at [facility], please arrive to the Heart and Vascular Center 15 mins early for check-in and test prep.  There is spacious parking and easy access to the radiology department from the [facility] V[facility] entrance. Please enter here and check-in with the desk attendant.   Please follow these instructions carefully (unless otherwise directed):  An IV will be required for this test and Nitroglycerin will be given.  Hold all erectile dysfunction medications at least 3 days (72 hrs) prior to test. (Ie viagra, cialis, sildenafil, tadalafil, etc)   On the Night Before the Test: Be sure to Drink plenty of water. Do not consume any caffeinated/decaffeinated beverages or chocolate 12 hours prior to your test. Do not take any antihistamines 12 hours prior to your test.  On the Day of the Test: Drink plenty of water until 1 hour prior to the test. Do not eat any food 1 hour prior to test. You may take your regular medications prior to the test.  Take metoprolol  (Lopressor ) two hours prior to test. If you take Furosemide/Hydrochlorothiazide/Spironolactone/Chlorthalidone, please HOLD on the morning of the test. Patients who wear a continuous glucose monitor MUST remove the device prior to scanning. FEMALES- please wear underwire-free bra if available, avoid dresses & tight clothing      After the Test: Drink plenty of water. After receiving IV contrast, you may experience a mild flushed feeling. This is normal. On occasion, you may experience a mild rash up to 24 hours after the test. This is not dangerous. If this occurs, you can take Benadryl 25 mg, Zyrtec, Claritin, or Allegra and increase your fluid intake. (Patients taking Tikosyn should avoid Benadryl, and may take  Zyrtec, Claritin, or Allegra) If you experience trouble breathing, this can be serious. If it is severe call 911 IMMEDIATELY. If it is mild, please call our office.  We will call to schedule your test 2-4 weeks out understanding that some insurance companies will need an authorization prior to the service being performed.   For more information and frequently asked questions, please visit our website : [email or web address]  For non-scheduling related questions, please contact the cardiac imaging nurse navigator should you have any questions/concerns: Cardiac Imaging Nurse Navigators Direct Office Dial: [phone]   For scheduling needs, including cancellations and rescheduling, please call Brittany, [phone].   Follow-Up: At [facility], you and your health needs are our priority.  As part of our continuing mission to provide you with exceptional heart care, our providers are all part of one team.  This team includes your primary Cardiologist (physician) and Advanced Practice Providers or APPs (Physician Assistants and Nurse Practitioners) who all work together to provide you with the care you need, when you need it.  Your next appointment:   3 month(s)  Provider:   You may see Dr Darliss or one of the following Advanced Practice Providers on your designated Care Team:   Lonni Meager, NP Lesley Maffucci, PA-C Bernardino Bring, PA-C Cadence Woodston, PA-C Tylene Lunch, NP Barnie Hila, NP    We recommend signing up for the patient portal called MyChart.  Sign up information is provided on this After Visit  Summary.  MyChart is used to connect with patients for Virtual Visits (Telemedicine).  Patients are able to view lab/test results, encounter notes, upcoming appointments, etc.  Non-urgent messages can be sent to your provider as well.   To learn more about what you can do with MyChart, go to [email or web address].               Signed, Redell Cave, MD  02/01/2024 9:51 AM    [facility]    [1]  Current Meds  Medication Sig   albuterol  (VENTOLIN  HFA) 108 (90 Base) MCG/ACT inhaler Inhale 1-2 puffs into the lungs every 6 (six) hours as needed.   fexofenadine (ALLEGRA) 180 MG tablet Take 180 mg by mouth daily.   fluticasone  (FLONASE) 50 MCG/ACT nasal spray Place into both nostrils daily.   fluticasone -salmeterol (ADVAIR HFA) 115-21 MCG/ACT inhaler Inhale 2 puffs into the lungs 2 (two) times daily.   levothyroxine  (SYNTHROID ) 88 MCG tablet Take 1 tablet (88 mcg total) by mouth daily with breakfast.   metoprolol  tartrate (LOPRESSOR ) 50 MG tablet TAKE 1 TABLET 2 HR PRIOR TO CARDIAC PROCEDURE   Multiple Vitamin (MULTIVITAMIN) tablet Take 1 tablet by mouth daily.   "

## 2024-02-01 NOTE — Patient Instructions (Signed)
 Medication Instructions:  - take 50 mg metoprolol  two hours prior to cardiac CTA  *If you need a refill on your cardiac medications before your next appointment, please call your pharmacy*  Lab Work: Your provider would like for you to have following labs drawn today BMP.    If you have labs (blood work) drawn today and your tests are completely normal, you will receive your results only by: MyChart Message (if you have MyChart) OR A paper copy in the mail If you have any lab test that is abnormal or we need to change your treatment, we will call you to review the results.  Testing/Procedures: Your physician has requested that you have an echocardiogram. Echocardiography is a painless test that uses sound waves to create images of your heart. It provides your doctor with information about the size and shape of your heart and how well your hearts chambers and valves are working.   You may receive an ultrasound enhancing agent through an IV if needed to better visualize your heart during the echo. This procedure takes approximately one hour.  There are no restrictions for this procedure.  This will take place at [address] [facility] [address]) [address], [place] [address]  Please note: We ask at that you not bring children with you during ultrasound (echo/ vascular) testing. Due to room size and safety concerns, children are not allowed in the ultrasound rooms during exams. Our front office staff cannot provide observation of children in our lobby area while testing is being conducted. An adult accompanying a patient to their appointment will only be allowed in the ultrasound room at the discretion of the ultrasound technician under special circumstances. We apologize for any inconvenience.   Your cardiac CT will be scheduled at one of the below locations:    [facility] [address] [place], [place] [address] [phone]  OR   [facility] [address]  [place], [place] [address]  If scheduled at the Heart and Vascular Tower at [address], please enter the parking lot using the [address] entrance and use the FREE valet service at the patient drop-off area. Enter the building and check-in with registration on the main floor.  If scheduled at [facility], please arrive to the Heart and Vascular Center 15 mins early for check-in and test prep.  There is spacious parking and easy access to the radiology department from the [facility] entrance. Please enter here and check-in with the desk attendant.   Please follow these instructions carefully (unless otherwise directed):  An IV will be required for this test and Nitroglycerin will be given.  Hold all erectile dysfunction medications at least 3 days (72 hrs) prior to test. (Ie viagra, cialis, sildenafil, tadalafil, etc)   On the Night Before the Test: Be sure to Drink plenty of water. Do not consume any caffeinated/decaffeinated beverages or chocolate 12 hours prior to your test. Do not take any antihistamines 12 hours prior to your test.  On the Day of the Test: Drink plenty of water until 1 hour prior to the test. Do not eat any food 1 hour prior to test. You may take your regular medications prior to the test.  Take metoprolol  (Lopressor ) two hours prior to test. If you take Furosemide/Hydrochlorothiazide/Spironolactone/Chlorthalidone, please HOLD on the morning of the test. Patients who wear a continuous glucose monitor MUST remove the device prior to scanning. FEMALES- please  wear underwire-free bra if available, avoid dresses & tight clothing      After the Test: Drink plenty of water. After receiving IV contrast, you may experience a mild flushed feeling. This is normal. On occasion, you may experience a mild rash up to 24 hours after the test. This is not dangerous. If this occurs, you can take  Benadryl 25 mg, Zyrtec, Claritin, or Allegra and increase your fluid intake. (Patients taking Tikosyn should avoid Benadryl, and may take Zyrtec, Claritin, or Allegra) If you experience trouble breathing, this can be serious. If it is severe call 911 IMMEDIATELY. If it is mild, please call our office.  We will call to schedule your test 2-4 weeks out understanding that some insurance companies will need an authorization prior to the service being performed.   For more information and frequently asked questions, please visit our website : [email or web address]  For non-scheduling related questions, please contact the cardiac imaging nurse navigator should you have any questions/concerns: Cardiac Imaging Nurse Navigators Direct Office Dial: [phone]   For scheduling needs, including cancellations and rescheduling, please call Brittany, [phone].   Follow-Up: At [facility], you and your health needs are our priority.  As part of our continuing mission to provide you with exceptional heart care, our providers are all part of one team.  This team includes your primary Cardiologist (physician) and Advanced Practice Providers or APPs (Physician Assistants and Nurse Practitioners) who all work together to provide you with the care you need, when you need it.  Your next appointment:   3 month(s)  Provider:   You may see Dr Darliss or one of the following Advanced Practice Providers on your designated Care Team:   Lonni Meager, NP Lesley Maffucci, PA-C Bernardino Bring, PA-C Cadence Rexford, PA-C Tylene Lunch, NP Barnie Hila, NP    We recommend signing up for the patient portal called MyChart.  Sign up information is provided on this After Visit Summary.  MyChart is used to connect with patients for Virtual Visits (Telemedicine).  Patients are able to view lab/test results, encounter notes, upcoming appointments, etc.  Non-urgent messages can be sent to your  provider as well.   To learn more about what you can do with MyChart, go to [email or web address].

## 2024-02-02 LAB — BASIC METABOLIC PANEL WITH GFR
BUN/Creatinine Ratio: 18 (ref 12–28)
BUN: 17 mg/dL (ref 8–27)
CO2: 22 mmol/L (ref 20–29)
Calcium: 9.6 mg/dL (ref 8.7–10.3)
Chloride: 103 mmol/L (ref 96–106)
Creatinine, Ser: 0.94 mg/dL (ref 0.57–1.00)
Glucose: 80 mg/dL (ref 70–99)
Potassium: 5.1 mmol/L (ref 3.5–5.2)
Sodium: 141 mmol/L (ref 134–144)
eGFR: 66 mL/min/1.73

## 2024-02-15 ENCOUNTER — Telehealth (HOSPITAL_COMMUNITY): Payer: Self-pay | Admitting: Emergency Medicine

## 2024-02-15 ENCOUNTER — Telehealth (HOSPITAL_COMMUNITY): Payer: Self-pay | Admitting: *Deleted

## 2024-02-15 ENCOUNTER — Encounter (HOSPITAL_COMMUNITY): Payer: Self-pay

## 2024-02-15 NOTE — Telephone Encounter (Signed)
 Attempted to call patient regarding upcoming cardiac CT appointment. Left message on voicemail with name and callback number [name] RN Navigator Cardiac Imaging [facility] Services [phone] Office [phone] Cell

## 2024-02-15 NOTE — Telephone Encounter (Signed)
 Patient returning call about her upcoming cardiac imaging study; pt verbalizes understanding of appt date/time, parking situation and where to check in, pre-test NPO status and medications ordered, and verified current allergies; name and call back number provided for further questions should they arise  Chantal Requena RN Navigator Cardiac Imaging [facility] [phone] office [phone] cell

## 2024-02-16 ENCOUNTER — Ambulatory Visit
Admission: RE | Admit: 2024-02-16 | Discharge: 2024-02-16 | Disposition: A | Source: Ambulatory Visit | Attending: Cardiology

## 2024-02-16 ENCOUNTER — Ambulatory Visit: Payer: Self-pay | Admitting: Cardiology

## 2024-02-16 DIAGNOSIS — R072 Precordial pain: Secondary | ICD-10-CM

## 2024-02-16 MED ORDER — NITROGLYCERIN 0.4 MG SL SUBL
0.8000 mg | SUBLINGUAL_TABLET | Freq: Once | SUBLINGUAL | Status: AC
  Administered 2024-02-16: 0.8 mg via SUBLINGUAL

## 2024-02-16 MED ORDER — IOHEXOL 350 MG/ML SOLN
100.0000 mL | Freq: Once | INTRAVENOUS | Status: AC | PRN
  Administered 2024-02-16: 100 mL via INTRAVENOUS

## 2024-02-16 NOTE — Progress Notes (Signed)
 Patient tolerated CT well. Drank water after. Vital signs stable encourage to drink water throughout day.Reasons explained and verbalized understanding. Ambulated steady gait.

## 2024-02-20 ENCOUNTER — Ambulatory Visit

## 2024-03-06 ENCOUNTER — Ambulatory Visit

## 2024-05-01 ENCOUNTER — Ambulatory Visit: Admitting: Cardiology
